# Patient Record
Sex: Male | Born: 1955 | Race: White | Hispanic: No | Marital: Married | Smoking: Former smoker
Health system: Southern US, Community
[De-identification: ages and names within clinical notes are randomized; demographics above are authoritative.]

## PROBLEM LIST (undated history)

## (undated) DIAGNOSIS — M1990 Unspecified osteoarthritis, unspecified site: Secondary | ICD-10-CM

## (undated) DIAGNOSIS — Z9889 Other specified postprocedural states: Secondary | ICD-10-CM

## (undated) DIAGNOSIS — T8859XA Other complications of anesthesia, initial encounter: Secondary | ICD-10-CM

## (undated) DIAGNOSIS — R233 Spontaneous ecchymoses: Secondary | ICD-10-CM

## (undated) DIAGNOSIS — G4730 Sleep apnea, unspecified: Secondary | ICD-10-CM

## (undated) DIAGNOSIS — R238 Other skin changes: Secondary | ICD-10-CM

## (undated) DIAGNOSIS — I10 Essential (primary) hypertension: Secondary | ICD-10-CM

## (undated) DIAGNOSIS — T4145XA Adverse effect of unspecified anesthetic, initial encounter: Secondary | ICD-10-CM

## (undated) DIAGNOSIS — R112 Nausea with vomiting, unspecified: Secondary | ICD-10-CM

## (undated) HISTORY — PX: COLONOSCOPY: SHX174

---
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
## 2007-05-06 HISTORY — PX: KNEE ARTHROSCOPY: SUR90

## 2010-05-05 HISTORY — PX: TENDON REPAIR: SHX5111

## 2010-10-11 ENCOUNTER — Other Ambulatory Visit (HOSPITAL_COMMUNITY): Payer: Self-pay | Admitting: Orthopedic Surgery

## 2010-10-11 ENCOUNTER — Ambulatory Visit (HOSPITAL_COMMUNITY)
Admission: RE | Admit: 2010-10-11 | Discharge: 2010-10-11 | Disposition: A | Discharge status: Home / Self Care | Payer: Worker's Compensation | Source: Ambulatory Visit | Attending: Orthopedic Surgery | Admitting: Orthopedic Surgery

## 2010-10-11 DIAGNOSIS — Z1389 Encounter for screening for other disorder: Secondary | ICD-10-CM | POA: Insufficient documentation

## 2010-10-11 DIAGNOSIS — J0100 Acute maxillary sinusitis, unspecified: Secondary | ICD-10-CM | POA: Insufficient documentation

## 2010-10-11 DIAGNOSIS — Z77018 Contact with and (suspected) exposure to other hazardous metals: Secondary | ICD-10-CM

## 2013-05-02 ENCOUNTER — Encounter (HOSPITAL_COMMUNITY): Payer: Self-pay

## 2013-05-03 ENCOUNTER — Other Ambulatory Visit (HOSPITAL_COMMUNITY): Payer: Self-pay | Admitting: *Deleted

## 2013-05-03 ENCOUNTER — Other Ambulatory Visit: Payer: Self-pay | Admitting: Orthopedic Surgery

## 2013-05-03 NOTE — Pre-Procedure Instructions (Signed)
Eric Reilly  05/03/2013   Your procedure is scheduled on:  Monday, May 16, 2013 at 9:55 AM.   Report to [facility] Entrance "A" Admitting Office at 7:55 AM.   Call this number if you have problems the morning of surgery: [phone]   Remember:   Do not eat food or drink liquids after midnight Sunday, 05/15/13.   Take these medicines the morning of surgery with A SIP OF WATER: none  Stop Aspirin as of 05/09/13   Do not wear jewelry.  Do not wear lotions, powders, or cologne. You may wear deodorant.  Men may shave face and neck.  Do not bring valuables to the hospital.  [facility] is not responsible                  for any belongings or valuables.               Contacts, dentures or bridgework may not be worn into surgery.  Leave suitcase in the car. After surgery it may be brought to your room.  For patients admitted to the hospital, discharge time is determined by your                treatment team.                 Special Instructions: Shower using CHG 2 nights before surgery and the night before surgery.  If you shower the day of surgery use CHG.  Use special wash - you have one bottle of CHG for all showers.  You should use approximately 1/3 of the bottle for each shower.   Please read over the following fact sheets that you were given: Pain Booklet, Coughing and Deep Breathing, Blood Transfusion Information, MRSA Information and Surgical Site Infection Prevention

## 2013-05-04 ENCOUNTER — Encounter (HOSPITAL_COMMUNITY)
Admission: RE | Admit: 2013-05-04 | Discharge: 2013-05-04 | Disposition: A | Discharge status: Home / Self Care | Payer: Worker's Compensation | Source: Ambulatory Visit | Attending: Orthopedic Surgery | Admitting: Orthopedic Surgery

## 2013-05-04 ENCOUNTER — Encounter (HOSPITAL_COMMUNITY): Payer: Self-pay

## 2013-05-04 DIAGNOSIS — Z01818 Encounter for other preprocedural examination: Secondary | ICD-10-CM | POA: Insufficient documentation

## 2013-05-04 DIAGNOSIS — Z01812 Encounter for preprocedural laboratory examination: Secondary | ICD-10-CM | POA: Insufficient documentation

## 2013-05-04 DIAGNOSIS — Z01810 Encounter for preprocedural cardiovascular examination: Secondary | ICD-10-CM | POA: Insufficient documentation

## 2013-05-04 HISTORY — DX: Spontaneous ecchymoses: R23.3

## 2013-05-04 HISTORY — DX: Essential (primary) hypertension: I10

## 2013-05-04 HISTORY — DX: Sleep apnea, unspecified: G47.30

## 2013-05-04 HISTORY — DX: Unspecified osteoarthritis, unspecified site: M19.90

## 2013-05-04 HISTORY — DX: Other skin changes: R23.8

## 2013-05-04 LAB — URINALYSIS, ROUTINE W REFLEX MICROSCOPIC
Bilirubin Urine: NEGATIVE
Hgb urine dipstick: NEGATIVE
Nitrite: NEGATIVE
Protein, ur: NEGATIVE mg/dL
Specific Gravity, Urine: 1.009 (ref 1.005–1.030)
Urobilinogen, UA: 0.2 mg/dL (ref 0.0–1.0)

## 2013-05-04 LAB — COMPREHENSIVE METABOLIC PANEL
ALT: 18 U/L (ref 0–53)
Albumin: 4 g/dL (ref 3.5–5.2)
Alkaline Phosphatase: 65 U/L (ref 39–117)
BUN: 13 mg/dL (ref 6–23)
CO2: 27 mEq/L (ref 19–32)
Calcium: 9 mg/dL (ref 8.4–10.5)
Creatinine, Ser: 0.78 mg/dL (ref 0.50–1.35)
GFR calc Af Amer: >90 mL/min (ref >90)
Glucose, Bld: 95 mg/dL (ref 70–99)
Potassium: 4.8 mEq/L (ref 3.7–5.3)
Total Protein: 6.9 g/dL (ref 6.0–8.3)

## 2013-05-04 LAB — CBC WITH DIFFERENTIAL/PLATELET
Basophils Relative: 1 % (ref 0–1)
Eosinophils Absolute: 0.1 10*3/uL (ref 0.0–0.7)
Eosinophils Relative: 2 % (ref 0–5)
Hemoglobin: 16.6 g/dL (ref 13.0–17.0)
Lymphocytes Relative: 22 % (ref 12–46)
Lymphs Abs: 1.6 10*3/uL (ref 0.7–4.0)
MCH: 31.3 pg (ref 26.0–34.0)
MCHC: 34.9 g/dL (ref 30.0–36.0)
MCV: 89.8 fL (ref 78.0–100.0)
Monocytes Absolute: 0.7 10*3/uL (ref 0.1–1.0)
Monocytes Relative: 9 % (ref 3–12)
Neutrophils Relative %: 67 % (ref 43–77)
RBC: 5.3 MIL/uL (ref 4.22–5.81)

## 2013-05-04 LAB — PROTIME-INR
INR: 0.93 (ref 0.00–1.49)
Prothrombin Time: 12.3 seconds (ref 11.6–15.2)

## 2013-05-04 LAB — ABO/RH: ABO/RH(D): B POS

## 2013-05-04 LAB — TYPE AND SCREEN
ABO/RH(D): B POS
Antibody Screen: NEGATIVE

## 2013-05-04 LAB — APTT: aPTT: 28 seconds (ref 24–37)

## 2013-05-04 NOTE — Progress Notes (Signed)
Last OV notes and sleep study requested from Dr. Darnelle Maffucci office in [place], [place].

## 2013-05-05 LAB — URINE CULTURE
Colony Count: NO GROWTH
Culture: NO GROWTH

## 2013-05-13 NOTE — Progress Notes (Signed)
Patient made aware of new arrival time. DA

## 2013-05-15 MED ORDER — TRANEXAMIC ACID 100 MG/ML IV SOLN
1000.0000 mg | INTRAVENOUS | Status: AC
  Administered 2013-05-16: 1000 mg via INTRAVENOUS
  Filled 2013-05-15 (×2): qty 10

## 2013-05-15 MED ORDER — CEFAZOLIN SODIUM 10 G IJ SOLR
3.0000 g | INTRAMUSCULAR | Status: AC
  Administered 2013-05-16: 3 g via INTRAVENOUS
  Filled 2013-05-15: qty 3000

## 2013-05-15 MED ORDER — CHLORHEXIDINE GLUCONATE 4 % EX LIQD: 60.0000 mL | Freq: Once | CUTANEOUS | Status: DC

## 2013-05-15 MED ORDER — SODIUM CHLORIDE 0.9 % IV SOLN: INTRAVENOUS | Status: DC

## 2013-05-15 MED ORDER — BUPIVACAINE LIPOSOME 1.3 % IJ SUSP
20.0000 mL | Freq: Once | INTRAMUSCULAR | Status: DC
  Filled 2013-05-15 (×2): qty 20

## 2013-05-16 ENCOUNTER — Inpatient Hospital Stay (HOSPITAL_COMMUNITY)
Admission: RE | Admit: 2013-05-16 | Discharge: 2013-05-17 | DRG: 470 | Disposition: A | Discharge status: Home Health | Payer: BC Managed Care – PPO | Source: Ambulatory Visit | Attending: Orthopedic Surgery | Admitting: Orthopedic Surgery

## 2013-05-16 ENCOUNTER — Encounter (HOSPITAL_COMMUNITY): Payer: BC Managed Care – PPO | Admitting: Anesthesiology

## 2013-05-16 ENCOUNTER — Encounter (HOSPITAL_COMMUNITY)
Admission: RE | Disposition: A | Discharge status: Home Health | Payer: Self-pay | Source: Ambulatory Visit | Attending: Orthopedic Surgery

## 2013-05-16 ENCOUNTER — Inpatient Hospital Stay (HOSPITAL_COMMUNITY): Payer: BC Managed Care – PPO | Admitting: Anesthesiology

## 2013-05-16 ENCOUNTER — Encounter (HOSPITAL_COMMUNITY): Payer: Self-pay

## 2013-05-16 DIAGNOSIS — D62 Acute posthemorrhagic anemia: Secondary | ICD-10-CM | POA: Diagnosis not present

## 2013-05-16 DIAGNOSIS — Z823 Family history of stroke: Secondary | ICD-10-CM

## 2013-05-16 DIAGNOSIS — F17200 Nicotine dependence, unspecified, uncomplicated: Secondary | ICD-10-CM | POA: Diagnosis present

## 2013-05-16 DIAGNOSIS — Z833 Family history of diabetes mellitus: Secondary | ICD-10-CM

## 2013-05-16 DIAGNOSIS — Z820 Family history of epilepsy and other diseases of the nervous system: Secondary | ICD-10-CM

## 2013-05-16 DIAGNOSIS — G4730 Sleep apnea, unspecified: Secondary | ICD-10-CM | POA: Diagnosis present

## 2013-05-16 DIAGNOSIS — I10 Essential (primary) hypertension: Secondary | ICD-10-CM | POA: Diagnosis present

## 2013-05-16 DIAGNOSIS — M1710 Unilateral primary osteoarthritis, unspecified knee: Principal | ICD-10-CM | POA: Diagnosis present

## 2013-05-16 DIAGNOSIS — Z8249 Family history of ischemic heart disease and other diseases of the circulatory system: Secondary | ICD-10-CM

## 2013-05-16 DIAGNOSIS — Z96659 Presence of unspecified artificial knee joint: Secondary | ICD-10-CM

## 2013-05-16 HISTORY — PX: TOTAL KNEE ARTHROPLASTY: SHX125

## 2013-05-16 LAB — CREATININE, SERUM
CREATININE: 0.78 mg/dL (ref 0.50–1.35)
GFR calc Af Amer: >90 mL/min (ref >90)

## 2013-05-16 LAB — CBC
HCT: 44.6 % (ref 39.0–52.0)
HEMOGLOBIN: 15.5 g/dL (ref 13.0–17.0)
MCH: 30.6 pg (ref 26.0–34.0)
MCHC: 34.8 g/dL (ref 30.0–36.0)
MCV: 88.1 fL (ref 78.0–100.0)
Platelets: 222 10*3/uL (ref 150–400)
RBC: 5.06 MIL/uL (ref 4.22–5.81)
RDW: 13.8 % (ref 11.5–15.5)
WBC: 11.4 10*3/uL — ABNORMAL HIGH (ref 4.0–10.5)

## 2013-05-16 SURGERY — ARTHROPLASTY, KNEE, TOTAL
Anesthesia: General | Site: Knee | Laterality: Left

## 2013-05-16 MED ORDER — ZOLPIDEM TARTRATE 5 MG PO TABS
5.0000 mg | ORAL_TABLET | Freq: Every evening | ORAL | Status: DC | PRN
Start: 2013-05-16 — End: 2013-05-17

## 2013-05-16 MED ORDER — CELECOXIB 200 MG PO CAPS
200.0000 mg | ORAL_CAPSULE | Freq: Two times a day (BID) | ORAL | Status: DC
  Administered 2013-05-16 – 2013-05-17 (×3): 200 mg via ORAL
  Filled 2013-05-16 (×5): qty 1

## 2013-05-16 MED ORDER — LIDOCAINE HCL (CARDIAC) 20 MG/ML IV SOLN
INTRAVENOUS | Status: DC | PRN
  Administered 2013-05-16: 100 mg via INTRAVENOUS

## 2013-05-16 MED ORDER — METOCLOPRAMIDE HCL 5 MG/ML IJ SOLN
5.0000 mg | Freq: Three times a day (TID) | INTRAMUSCULAR | Status: DC | PRN
  Filled 2013-05-16: qty 2

## 2013-05-16 MED ORDER — HYDROMORPHONE HCL PF 1 MG/ML IJ SOLN
INTRAMUSCULAR | Status: AC
  Filled 2013-05-16: qty 1

## 2013-05-16 MED ORDER — METOCLOPRAMIDE HCL 5 MG PO TABS
5.0000 mg | ORAL_TABLET | Freq: Three times a day (TID) | ORAL | Status: DC | PRN
  Filled 2013-05-16: qty 2

## 2013-05-16 MED ORDER — PROPOFOL 10 MG/ML IV BOLUS
INTRAVENOUS | Status: DC | PRN
  Administered 2013-05-16: 200 mg via INTRAVENOUS

## 2013-05-16 MED ORDER — SENNOSIDES-DOCUSATE SODIUM 8.6-50 MG PO TABS: 1.0000 | ORAL_TABLET | Freq: Every evening | ORAL | Status: DC | PRN

## 2013-05-16 MED ORDER — OXYCODONE HCL 5 MG PO TABS: 5.0000 mg | ORAL_TABLET | Freq: Once | ORAL | Status: DC | PRN

## 2013-05-16 MED ORDER — OXYCODONE HCL 5 MG/5ML PO SOLN: 5.0000 mg | Freq: Once | ORAL | Status: DC | PRN

## 2013-05-16 MED ORDER — NEOSTIGMINE METHYLSULFATE 1 MG/ML IJ SOLN
INTRAMUSCULAR | Status: DC | PRN
  Administered 2013-05-16: 4 mg via INTRAVENOUS

## 2013-05-16 MED ORDER — DIPHENHYDRAMINE HCL 12.5 MG/5ML PO ELIX: 12.5000 mg | ORAL_SOLUTION | ORAL | Status: DC | PRN

## 2013-05-16 MED ORDER — ROCURONIUM BROMIDE 100 MG/10ML IV SOLN
INTRAVENOUS | Status: DC | PRN
  Administered 2013-05-16: 50 mg via INTRAVENOUS

## 2013-05-16 MED ORDER — FENTANYL CITRATE 0.05 MG/ML IJ SOLN: 50.0000 ug | Freq: Once | INTRAMUSCULAR | Status: DC

## 2013-05-16 MED ORDER — ACETAMINOPHEN 650 MG RE SUPP
650.0000 mg | Freq: Four times a day (QID) | RECTAL | Status: DC | PRN
Start: 2013-05-16 — End: 2013-05-17

## 2013-05-16 MED ORDER — EPHEDRINE SULFATE 50 MG/ML IJ SOLN
INTRAMUSCULAR | Status: DC | PRN
Start: 2013-05-16 — End: 2013-05-16
  Administered 2013-05-16 (×2): 15 mg via INTRAVENOUS

## 2013-05-16 MED ORDER — BUPIVACAINE-EPINEPHRINE 0.5% -1:200000 IJ SOLN
INTRAMUSCULAR | Status: DC | PRN
  Administered 2013-05-16: 30 mL

## 2013-05-16 MED ORDER — SODIUM CHLORIDE 0.9 % IR SOLN
Status: DC | PRN
  Administered 2013-05-16: 1000 mL

## 2013-05-16 MED ORDER — PHENYLEPHRINE HCL 10 MG/ML IJ SOLN
INTRAMUSCULAR | Status: DC | PRN
  Administered 2013-05-16: 40 ug via INTRAVENOUS

## 2013-05-16 MED ORDER — FLEET ENEMA 7-19 GM/118ML RE ENEM: 1.0000 | ENEMA | Freq: Once | RECTAL | Status: AC | PRN

## 2013-05-16 MED ORDER — ONDANSETRON HCL 4 MG PO TABS
4.0000 mg | ORAL_TABLET | Freq: Four times a day (QID) | ORAL | Status: DC | PRN
  Administered 2013-05-16: 4 mg via ORAL
  Filled 2013-05-16: qty 1

## 2013-05-16 MED ORDER — BUPIVACAINE-EPINEPHRINE (PF) 0.5% -1:200000 IJ SOLN
INTRAMUSCULAR | Status: AC
  Filled 2013-05-16: qty 10

## 2013-05-16 MED ORDER — ALUM & MAG HYDROXIDE-SIMETH 200-200-20 MG/5ML PO SUSP: 30.0000 mL | ORAL | Status: DC | PRN

## 2013-05-16 MED ORDER — MIDAZOLAM HCL 5 MG/5ML IJ SOLN
INTRAMUSCULAR | Status: DC | PRN
  Administered 2013-05-16: 2 mg via INTRAVENOUS

## 2013-05-16 MED ORDER — BUPIVACAINE-EPINEPHRINE PF 0.5-1:200000 % IJ SOLN
INTRAMUSCULAR | Status: DC | PRN
  Administered 2013-05-16: 25 mL

## 2013-05-16 MED ORDER — OXYCODONE HCL 5 MG PO TABS
ORAL_TABLET | ORAL | Status: AC
  Filled 2013-05-16: qty 2

## 2013-05-16 MED ORDER — METHOCARBAMOL 500 MG PO TABS
500.0000 mg | ORAL_TABLET | Freq: Four times a day (QID) | ORAL | Status: DC | PRN
  Administered 2013-05-16 – 2013-05-17 (×4): 500 mg via ORAL
  Filled 2013-05-16 (×5): qty 1

## 2013-05-16 MED ORDER — MIDAZOLAM HCL 2 MG/2ML IJ SOLN: 1.0000 mg | INTRAMUSCULAR | Status: DC | PRN

## 2013-05-16 MED ORDER — ONDANSETRON HCL 4 MG/2ML IJ SOLN: 4.0000 mg | Freq: Four times a day (QID) | INTRAMUSCULAR | Status: DC | PRN

## 2013-05-16 MED ORDER — HYDROMORPHONE HCL PF 1 MG/ML IJ SOLN
0.2500 mg | INTRAMUSCULAR | Status: DC | PRN
  Administered 2013-05-16: 0.5 mg via INTRAVENOUS
  Administered 2013-05-16: 1 mg via INTRAVENOUS
  Administered 2013-05-16: 0.5 mg via INTRAVENOUS

## 2013-05-16 MED ORDER — DOCUSATE SODIUM 100 MG PO CAPS
100.0000 mg | ORAL_CAPSULE | Freq: Two times a day (BID) | ORAL | Status: DC
  Administered 2013-05-16 – 2013-05-17 (×3): 100 mg via ORAL
  Filled 2013-05-16 (×3): qty 1

## 2013-05-16 MED ORDER — ACETAMINOPHEN 325 MG PO TABS: 650.0000 mg | ORAL_TABLET | Freq: Four times a day (QID) | ORAL | Status: DC | PRN

## 2013-05-16 MED ORDER — HYDROMORPHONE HCL PF 1 MG/ML IJ SOLN: 1.0000 mg | INTRAMUSCULAR | Status: DC | PRN

## 2013-05-16 MED ORDER — PROMETHAZINE HCL 25 MG/ML IJ SOLN: 6.2500 mg | INTRAMUSCULAR | Status: DC | PRN

## 2013-05-16 MED ORDER — ONDANSETRON HCL 4 MG/2ML IJ SOLN
INTRAMUSCULAR | Status: DC | PRN
  Administered 2013-05-16: 4 mg via INTRAVENOUS

## 2013-05-16 MED ORDER — DEXAMETHASONE SODIUM PHOSPHATE 10 MG/ML IJ SOLN
INTRAMUSCULAR | Status: DC | PRN
  Administered 2013-05-16: 4 mg

## 2013-05-16 MED ORDER — ENOXAPARIN SODIUM 30 MG/0.3ML ~~LOC~~ SOLN
30.0000 mg | Freq: Two times a day (BID) | SUBCUTANEOUS | Status: DC
  Administered 2013-05-17: 30 mg via SUBCUTANEOUS
  Filled 2013-05-16 (×2): qty 0.3

## 2013-05-16 MED ORDER — LACTATED RINGERS IV SOLN
INTRAVENOUS | Status: DC | PRN
  Administered 2013-05-16 (×2): via INTRAVENOUS

## 2013-05-16 MED ORDER — SODIUM CHLORIDE 0.9 % IV SOLN
INTRAVENOUS | Status: DC
  Administered 2013-05-16 – 2013-05-17 (×2): 1000 mL via INTRAVENOUS

## 2013-05-16 MED ORDER — LISINOPRIL 5 MG PO TABS
5.0000 mg | ORAL_TABLET | Freq: Every day | ORAL | Status: DC
Start: 2013-05-16 — End: 2013-05-17
  Administered 2013-05-16: 5 mg via ORAL
  Filled 2013-05-16 (×2): qty 1

## 2013-05-16 MED ORDER — MENTHOL 3 MG MT LOZG: 1.0000 | LOZENGE | OROMUCOSAL | Status: DC | PRN

## 2013-05-16 MED ORDER — METHOCARBAMOL 100 MG/ML IJ SOLN
500.0000 mg | Freq: Four times a day (QID) | INTRAVENOUS | Status: DC | PRN
  Filled 2013-05-16: qty 5

## 2013-05-16 MED ORDER — CEFAZOLIN SODIUM-DEXTROSE 2-3 GM-% IV SOLR
2.0000 g | Freq: Four times a day (QID) | INTRAVENOUS | Status: AC
  Administered 2013-05-16 (×2): 2 g via INTRAVENOUS
  Filled 2013-05-16 (×2): qty 50

## 2013-05-16 MED ORDER — OXYCODONE HCL 5 MG PO TABS
5.0000 mg | ORAL_TABLET | ORAL | Status: DC | PRN
  Administered 2013-05-16: 10 mg via ORAL
  Administered 2013-05-16: 5 mg via ORAL
  Administered 2013-05-16: 10 mg via ORAL
  Administered 2013-05-16: 5 mg via ORAL
  Administered 2013-05-16 – 2013-05-17 (×2): 10 mg via ORAL
  Administered 2013-05-17 (×2): 5 mg via ORAL
  Administered 2013-05-17: 10 mg via ORAL
  Filled 2013-05-16 (×5): qty 2
  Filled 2013-05-16: qty 1
  Filled 2013-05-16 (×2): qty 2

## 2013-05-16 MED ORDER — TRANEXAMIC ACID 100 MG/ML IV SOLN
1000.0000 mg | INTRAVENOUS | Status: DC
  Filled 2013-05-16: qty 10

## 2013-05-16 MED ORDER — METHOCARBAMOL 500 MG PO TABS
ORAL_TABLET | ORAL | Status: AC
  Filled 2013-05-16: qty 1

## 2013-05-16 MED ORDER — FENTANYL CITRATE 0.05 MG/ML IJ SOLN
INTRAMUSCULAR | Status: DC | PRN
  Administered 2013-05-16 (×5): 50 ug via INTRAVENOUS

## 2013-05-16 MED ORDER — BISACODYL 5 MG PO TBEC: 5.0000 mg | DELAYED_RELEASE_TABLET | Freq: Every day | ORAL | Status: DC | PRN

## 2013-05-16 MED ORDER — BUPIVACAINE LIPOSOME 1.3 % IJ SUSP
INTRAMUSCULAR | Status: DC | PRN
  Administered 2013-05-16: 20 mL

## 2013-05-16 MED ORDER — PHENOL 1.4 % MT LIQD: 1.0000 | OROMUCOSAL | Status: DC | PRN

## 2013-05-16 MED ORDER — GLYCOPYRROLATE 0.2 MG/ML IJ SOLN
INTRAMUSCULAR | Status: DC | PRN
  Administered 2013-05-16: .6 mg via INTRAVENOUS

## 2013-05-16 SURGICAL SUPPLY — 61 items
BANDAGE ESMARK 6X9 LF (GAUZE/BANDAGES/DRESSINGS) ×1 IMPLANT
BLADE SAGITTAL 13X1.27X60 (BLADE) ×2 IMPLANT
BLADE SAGITTAL 13X1.27X60MM (BLADE) ×1
BLADE SAW SGTL 83.5X18.5 (BLADE) ×3 IMPLANT
BLADE SURG 10 STRL SS (BLADE) ×6 IMPLANT
BNDG ESMARK 6X9 LF (GAUZE/BANDAGES/DRESSINGS) ×3
BOWL SMART MIX CTS (DISPOSABLE) ×3 IMPLANT
CAP POR NKTM CP VIT E LN CER ×3 IMPLANT
CEMENT BONE SIMPLEX SPEEDSET (Cement) ×6 IMPLANT
COVER SURGICAL LIGHT HANDLE (MISCELLANEOUS) ×3 IMPLANT
CUFF TOURNIQUET SINGLE 34IN LL (TOURNIQUET CUFF) ×3 IMPLANT
DRAPE EXTREMITY T 121X128X90 (DRAPE) ×3 IMPLANT
DRAPE INCISE IOBAN 66X45 STRL (DRAPES) ×6 IMPLANT
DRAPE PROXIMA HALF (DRAPES) ×3 IMPLANT
DRAPE U-SHAPE 47X51 STRL (DRAPES) ×3 IMPLANT
DRSG ADAPTIC 3X8 NADH LF (GAUZE/BANDAGES/DRESSINGS) ×3 IMPLANT
DRSG PAD ABDOMINAL 8X10 ST (GAUZE/BANDAGES/DRESSINGS) ×3 IMPLANT
DURAPREP 26ML APPLICATOR (WOUND CARE) ×3 IMPLANT
ELECT REM PT RETURN 9FT ADLT (ELECTROSURGICAL) ×3
ELECTRODE REM PT RTRN 9FT ADLT (ELECTROSURGICAL) ×1 IMPLANT
EVACUATOR 1/8 PVC DRAIN (DRAIN) ×3 IMPLANT
GLOVE BIO SURGEON STRL SZ8.5 (GLOVE) ×3 IMPLANT
GLOVE BIOGEL M 7.0 STRL (GLOVE) IMPLANT
GLOVE BIOGEL PI IND STRL 7.5 (GLOVE) IMPLANT
GLOVE BIOGEL PI IND STRL 8 (GLOVE) ×1 IMPLANT
GLOVE BIOGEL PI IND STRL 8.5 (GLOVE) ×2 IMPLANT
GLOVE BIOGEL PI INDICATOR 7.5 (GLOVE)
GLOVE BIOGEL PI INDICATOR 8 (GLOVE) ×2
GLOVE BIOGEL PI INDICATOR 8.5 (GLOVE) ×4
GLOVE SURG ORTHO 8.0 STRL STRW (GLOVE) ×9 IMPLANT
GLOVE SURG SS PI 8.5 STRL IVOR (GLOVE) ×2
GLOVE SURG SS PI 8.5 STRL STRW (GLOVE) ×1 IMPLANT
GOWN PREVENTION PLUS XLARGE (GOWN DISPOSABLE) IMPLANT
GOWN STRL NON-REIN LRG LVL3 (GOWN DISPOSABLE) IMPLANT
GOWN STRL REUS W/ TWL XL LVL3 (GOWN DISPOSABLE) ×3 IMPLANT
GOWN STRL REUS W/TWL XL LVL3 (GOWN DISPOSABLE) ×6
HANDPIECE INTERPULSE COAX TIP (DISPOSABLE) ×2
HOOD PEEL AWAY FACE SHEILD DIS (HOOD) ×9 IMPLANT
KIT BASIN OR (CUSTOM PROCEDURE TRAY) ×3 IMPLANT
KIT ROOM TURNOVER OR (KITS) ×3 IMPLANT
MANIFOLD NEPTUNE II (INSTRUMENTS) ×3 IMPLANT
NEEDLE 22X1 1/2 (OR ONLY) (NEEDLE) ×3 IMPLANT
NS IRRIG 1000ML POUR BTL (IV SOLUTION) IMPLANT
PACK TOTAL JOINT (CUSTOM PROCEDURE TRAY) ×3 IMPLANT
PAD ARMBOARD 7.5X6 YLW CONV (MISCELLANEOUS) ×3 IMPLANT
PADDING CAST COTTON 6X4 STRL (CAST SUPPLIES) ×3 IMPLANT
SET HNDPC FAN SPRY TIP SCT (DISPOSABLE) ×1 IMPLANT
SPONGE GAUZE 4X4 12PLY (GAUZE/BANDAGES/DRESSINGS) ×3 IMPLANT
STAPLER VISISTAT 35W (STAPLE) ×3 IMPLANT
SUCTION FRAZIER TIP 10 FR DISP (SUCTIONS) ×3 IMPLANT
SUT BONE WAX W31G (SUTURE) ×3 IMPLANT
SUT VIC AB 0 CTB1 27 (SUTURE) ×6 IMPLANT
SUT VIC AB 1 CT1 27 (SUTURE) ×6
SUT VIC AB 1 CT1 27XBRD ANBCTR (SUTURE) ×3 IMPLANT
SUT VIC AB 2-0 CT1 27 (SUTURE) ×4
SUT VIC AB 2-0 CT1 TAPERPNT 27 (SUTURE) ×2 IMPLANT
SYR CONTROL 10ML LL (SYRINGE) ×3 IMPLANT
TOWEL OR 17X24 6PK STRL BLUE (TOWEL DISPOSABLE) ×3 IMPLANT
TOWEL OR 17X26 10 PK STRL BLUE (TOWEL DISPOSABLE) ×3 IMPLANT
TRAY FOLEY CATH 14FR (SET/KITS/TRAYS/PACK) IMPLANT
WATER STERILE IRR 1000ML POUR (IV SOLUTION) IMPLANT

## 2013-05-16 NOTE — Anesthesia Procedure Notes (Addendum)
Anesthesia Regional Block:  Adductor canal block  Pre-Anesthetic Checklist: ,, timeout performed, Correct Patient, Correct Site, Correct Laterality, Correct Procedure, Correct Position, site marked, Risks and benefits discussed,  Surgical consent,  Pre-op evaluation,  At surgeon's request and post-op pain management  Laterality: Left  Prep: chloraprep       Needles:  Injection technique: Single-shot  Needle Type: Echogenic Stimulator Needle      Needle Gauge: 22 and 22 G    Additional Needles:  Procedures: ultrasound guided (picture in chart) Adductor canal block Narrative:  Start time: 05/16/2013 7:02 AM End time: 05/16/2013 7:16 AM Injection made incrementally with aspirations every 5 mL. Anesthesiologist: Dr Gypsy BalsamKasik  Additional Notes: [id number]: [id number] L Adductor Canal Block POP CHG prep, sterile tech Good US visualization-PIX in chart Multiple neg asp Marc .5% w/epi 1:200000 total 25cc+decadron 4mg  infiltrated No compl Dr Gypsy BalsamKasik   Procedure Name: Intubation Date/Time: 05/16/2013 7:39 AM Performed by: Rogelia BogaMUELLER, Kaylee Wombles P Pre-anesthesia Checklist: Patient identified, Emergency Drugs available, Suction available, Patient being monitored and Timeout performed Patient Re-evaluated:Patient Re-evaluated prior to inductionOxygen Delivery Method: Circle system utilized Preoxygenation: Pre-oxygenation with 100% oxygen Intubation Type: IV induction Ventilation: Mask ventilation without difficulty Laryngoscope Size: Mac and 4 Grade View: Grade II Tube type: Oral Tube size: 7.5 mm Number of attempts: 1 Airway Equipment and Method: Stylet Placement Confirmation: ETT inserted through vocal cords under direct vision,  positive ETCO2 and breath sounds checked- equal and bilateral Secured at: 22 cm Tube secured with: Tape Dental Injury: Teeth and Oropharynx as per pre-operative assessment

## 2013-05-16 NOTE — Anesthesia Postprocedure Evaluation (Signed)
  Anesthesia Post-op Note  Patient: Eric Reilly  Procedure(s) Performed: Procedure(s): LEFT TOTAL KNEE ARTHROPLASTY (Left)  Patient Location: PACU  Anesthesia Type:GA combined with regional for post-op pain  Level of Consciousness: awake and alert   Airway and Oxygen Therapy: Patient Spontanous Breathing  Post-op Pain: mild  Post-op Assessment: Post-op Vital signs reviewed, Patient's Cardiovascular Status Stable, Respiratory Function Stable, Patent Airway, No signs of Nausea or vomiting and Pain level controlled  Post-op Vital Signs: Reviewed and stable  Complications: No apparent anesthesia complications

## 2013-05-16 NOTE — Anesthesia Preprocedure Evaluation (Addendum)
Anesthesia Evaluation  Patient identified by MRN, date of birth, ID band Patient awake    Reviewed: Allergy & Precautions, H&P , NPO status , Patient's Chart, lab work & pertinent test results  Airway Mallampati: II TM Distance: >3 FB Neck ROM: Full    Dental  (+) Teeth Intact   Pulmonary sleep apnea and Continuous Positive Airway Pressure Ventilation , COPDCurrent Smoker,  + rhonchi         Cardiovascular hypertension, Pt. on medications Rhythm:Regular Rate:Normal     Neuro/Psych    GI/Hepatic   Endo/Other    Renal/GU      Musculoskeletal   Abdominal   Peds  Hematology   Anesthesia Other Findings   Reproductive/Obstetrics                         Anesthesia Physical Anesthesia Plan  ASA: II  Anesthesia Plan: General   Post-op Pain Management:    Induction: Intravenous  Airway Management Planned: Oral ETT  Additional Equipment:   Intra-op Plan:   Post-operative Plan: Extubation in OR  Informed Consent: I have reviewed the patients History and Physical, chart, labs and discussed the procedure including the risks, benefits and alternatives for the proposed anesthesia with the patient or authorized representative who has indicated his/her understanding and acceptance.   Dental advisory given  Plan Discussed with: CRNA, Surgeon and Anesthesiologist  Anesthesia Plan Comments:        Anesthesia Quick Evaluation

## 2013-05-16 NOTE — Progress Notes (Signed)
Utilization review completed.  

## 2013-05-16 NOTE — Progress Notes (Addendum)
Orthopedic Tech Progress Note Patient Details:  Nonnie DoneJeffrey Laswell [birth year] [id number] CPM applied to Left LE with appropriate settings. OHF applied to bed. Footsie roll provided. CPM Left Knee CPM Left Knee: On Left Knee Flexion (Degrees): 90 Left Knee Extension (Degrees): 0   Asia R Thompson 05/16/2013, 10:47 AM

## 2013-05-16 NOTE — Transfer of Care (Signed)
Immediate Anesthesia Transfer of Care Note  Patient: Eric Reilly  Procedure(s) Performed: Procedure(s): LEFT TOTAL KNEE ARTHROPLASTY (Left)  Patient Location: PACU  Anesthesia Type:GA combined with regional for post-op pain  Level of Consciousness: awake, alert , oriented and patient cooperative  Airway & Oxygen Therapy: Patient Spontanous Breathing and Patient connected to nasal cannula oxygen  Post-op Assessment: Report given to PACU RN, Post -op Vital signs reviewed and stable and Patient moving all extremities X 4  Post vital signs: Reviewed and stable  Complications: No apparent anesthesia complications

## 2013-05-16 NOTE — Op Note (Signed)
TOTAL KNEE REPLACEMENT OPERATIVE NOTE:  05/16/2013  10:50 AM  PATIENT:  Eric DoneJeffrey Difonzo  58 y.o. male  PRE-OPERATIVE DIAGNOSIS:  osteoarthritis left knee  POST-OPERATIVE DIAGNOSIS:  osteoarthritis left knee  PROCEDURE:  Procedure(s): LEFT TOTAL KNEE ARTHROPLASTY  SURGEON:  Surgeon(s): Dannielle HuhSteve Zurie Platas, MD  PHYSICIAN ASSISTANT: Altamese CabalMaurice Jones, [facility]  ANESTHESIA:   general  DRAINS: Hemovac  SPECIMEN: None  COUNTS:  Correct  TOURNIQUET:   Total Tourniquet Time Documented: Thigh (Left) - 63 minutes Total: Thigh (Left) - 63 minutes   DICTATION:  Indication for procedure:    The patient is a 58 y.o. male who has failed conservative treatment for osteoarthritis left knee.  Informed consent was obtained prior to anesthesia. The risks versus benefits of the operation were explain and in a way the patient can, and did, understand.   On the implant demand matching protocol, this patient scored 9.  Therefore, this patient was not receive a polyethylene insert with vitamin E which is a high demand implant.  Description of procedure:     The patient was taken to the operating room and placed under anesthesia.  The patient was positioned in the usual fashion taking care that all body parts were adequately padded and/or protected.  I foley catheter was not placed.  A tourniquet was applied and the leg prepped and draped in the usual sterile fashion.  The extremity was exsanguinated with the esmarch and tourniquet inflated to 350 mmHg.  Pre-operative range of motion was normal.  The knee was in 5 degree of mild valgus.  A midline incision approximately 6-7 inches long was made with a #10 blade.  A new blade was used to make a parapatellar arthrotomy going 2-3 cm into the quadriceps tendon, over the patella, and alongside the medial aspect of the patellar tendon.  A synovectomy was then performed with the #10 blade and forceps. I then elevated the deep MCL off the medial tibial metaphysis  subperiosteally around to the semimembranosus attachment.    I everted the patella and used calipers to measure patellar thickness.  I used the reamer to ream down to appropriate thickness to recreate the native thickness.  I then removed excess bone with the rongeur and sagittal saw.  I used the appropriately sized template and drilled the three lug holes.  I then put the trial in place and measured the thickness with the calipers to ensure recreation of the native thickness.  The trial was then removed and the patella subluxed and the knee brought into flexion.  A homan retractor was place to retract and protect the patella and lateral structures.  A Z-retractor was place medially to protect the medial structures.  The extra-medullary alignment system was used to make cut the tibial articular surface perpendicular to the anamotic axis of the tibia and in 3 degrees of posterior slope.  The cut surface and alignment jig was removed.  I then used the intramedullary alignment guide to make a 4 valgus cut on the distal femur.  I then marked out the epicondylar axis on the distal femur.  The posterior condylar axis measured 8 degrees.  I then used the anterior referencing sizer and measured the femur to be a size 6.  The 4-In-1 cutting block was screwed into place in external rotation matching the posterior condylar angle, making our cuts perpendicular to the epicondylar axis.  Anterior, posterior and chamfer cuts were made with the sagittal saw.  The cutting block and cut pieces were removed.  A  lamina spreader was placed in 90 degrees of flexion.  The ACL, PCL, menisci, and posterior condylar osteophytes were removed.  A 10 mm spacer blocked was found to offer good flexion and extension gap balance after moderate in degree releasing.   The scoop retractor was then placed and the femoral finishing block was pinned in place.  The small sagittal saw was used as well as the lug drill to finish the femur.  The  block and cut surfaces were removed and the medullary canal hole filled with autograft bone from the cut pieces.  The tibia was delivered forward in deep flexion and external rotation.  A size E tray was selected and pinned into place centered on the medial 1/3 of the tibial tubercle.  The reamer and keel was used to prepare the tibia through the tray.    I then trialed with the size 8 femur, size E tibia, a 10 mm insert and the 32 patella.  I had excellent flexion/extension gap balance, excellent patella tracking.  Flexion was full and beyond 120 degrees; extension was zero.  These components were chosen and the staff opened them to me on the back table while the knee was lavaged copiously and the cement mixed.  The soft tissue was infiltrated with 60cc of exparel 1.3% through a 21 gauge needle.  I cemented in the components and removed all excess cement.  The polyethylene tibial component was snapped into place and the knee placed in extension while cement was hardening.  The capsule was infilltrated with 30cc of .25% Marcaine with epinephrine.  A hemovac was place in the joint exiting superolaterally.  A pain pump was place superomedially superficial to the arthrotomy.  Once the cement was hard, the tourniquet was let down.  Hemostasis was obtained.  The arthrotomy was closed with figure-8 #1 vicryl sutures.  The deep soft tissues were closed with #0 vicryls and the subcuticular layer closed with a running #2-0 vicryl.  The skin was reapproximated and closed with skin staples.  The wound was dressed with xeroform, 4 x4's, 2 ABD sponges, a single layer of webril and a TED stocking.   The patient was then awakened, extubated, and taken to the recovery room in stable condition.  BLOOD LOSS:  300cc DRAINS: 1 hemovac, 1 pain catheter COMPLICATIONS:  None.  PLAN OF CARE: Admit to inpatient   PATIENT DISPOSITION:  PACU - hemodynamically stable.   Delay start of Pharmacological VTE agent (>24hrs) due to  surgical blood loss or risk of bleeding:  not applicable  Please fax a copy of this op note to my office at [phone] (please only include page 1 and 2 of the Case Information op note)

## 2013-05-16 NOTE — Preoperative (Signed)
Beta Blockers   Reason not to administer Beta Blockers:Not Applicable 

## 2013-05-16 NOTE — H&P (Signed)
Eric Reilly MRN:  [id number] DOB/SEX:  January 28, 1956/male  CHIEF COMPLAINT:  Painful left Knee  HISTORY: Patient is a 58 y.o. male presented with a history of pain in the left knee. Onset of symptoms was gradual starting several years ago with gradually worsening course since that time. Prior procedures on the knee include arthroscopy. Patient has been treated conservatively with over-the-counter NSAIDs and activity modification. Patient currently rates pain in the knee at 9 out of 10 with activity. There is no pain at night.  PAST MEDICAL HISTORY: There are no active problems to display for this patient.  Past Medical History  Diagnosis Date  . Hypertension   . Sleep apnea   . Arthritis     osteoarthritis, left knee  . Bruises easily    Past Surgical History  Procedure Laterality Date  . Knee arthroscopy Left 2009  . Colonoscopy    . Tendon repair Left 2012     MEDICATIONS:   Prescriptions prior to admission  Medication Sig Dispense Refill  . aspirin 81 MG tablet Take 81 mg by mouth daily.      Marland Kitchen. lisinopril (PRINIVIL,ZESTRIL) 5 MG tablet Take 5 mg by mouth daily.        ALLERGIES:  No Known Allergies  REVIEW OF SYSTEMS:  Pertinent items are noted in HPI.   FAMILY HISTORY:   Family History  Problem Relation Age of Onset  . Cancer Mother   . Diabetes type II Mother   . Alzheimer's disease Mother   . CVA Father   . Heart disease Father     SOCIAL HISTORY:   History  Substance Use Topics  . Smoking status: Current Every Day Smoker -- 1.00 packs/day    Types: Cigarettes  . Smokeless tobacco: Former NeurosurgeonUser    Types: Chew  . Alcohol Use: Yes     Comment: "very little"     EXAMINATION:  Vital signs in last 24 hours: Temp:  [98.1 F (36.7 C)] 98.1 F (36.7 C) (01/12 0620) Pulse Rate:  [59] 59 (01/12 0620) Resp:  [18] 18 (01/12 0620) BP: (137)/(72) 137/72 mmHg (01/12 0620) SpO2:  [98 %] 98 % (01/12 0620)  General appearance: alert, cooperative and no  distress Lungs: clear to auscultation bilaterally Heart: regular rate and rhythm, S1, S2 normal, no murmur, click, rub or gallop Abdomen: soft, non-tender; bowel sounds normal; no masses,  no organomegaly Extremities: extremities normal, atraumatic, no cyanosis or edema and Homans sign is negative, no sign of DVT Pulses: 2+ and symmetric Skin: Skin color, texture, turgor normal. No rashes or lesions Neurologic: Alert and oriented X 3, normal strength and tone. Normal symmetric reflexes. Normal coordination and gait  Musculoskeletal:  ROM 0-115, Ligaments intact,  Imaging Review Plain radiographs demonstrate severe degenerative joint disease of the left knee. The overall alignment is mild valgus. The bone quality appears to be good for age and reported activity level.  Assessment/Plan: End stage arthritis, left knee   The patient history, physical examination and imaging studies are consistent with advanced degenerative joint disease of the left knee. The patient has failed conservative treatment.  The clearance notes were reviewed.  After discussion with the patient it was felt that Total Knee Replacement was indicated. The procedure,  risks, and benefits of total knee arthroplasty were presented and reviewed. The risks including but not limited to aseptic loosening, infection, blood clots, vascular injury, stiffness, patella tracking problems complications among others were discussed. The patient acknowledged the explanation, agreed to proceed with the  plan.  Eric Reilly 05/16/2013, 7:18 AM

## 2013-05-16 NOTE — Evaluation (Signed)
Physical Therapy Evaluation Patient Details Name: Nonnie DoneJeffrey Pudwill MRN: [id number] DOB: [birth year] Today's Date: 05/16/2013 Time: [id number] PT Time Calculation (min): 37 min  PT Assessment / Plan / Recommendation History of Present Illness  Patient is a 58 yo male s/p Lt TKA.  Clinical Impression  Patient presents with problems listed below.  Will benefit from acute PT to maximize independence prior to discharge home with wife.    PT Assessment  Patient needs continued PT services    Follow Up Recommendations  Home health PT;Supervision/Assistance - 24 hour    Does the patient have the potential to tolerate intense rehabilitation      Barriers to Discharge        Equipment Recommendations  Rolling walker with 5" wheels;3in1 (PT) (Patient reports already ordered - waiting delivery)    Recommendations for Other Services     Frequency 7X/week    Precautions / Restrictions Precautions Precautions: Knee Precaution Booklet Issued: Yes (comment) Precaution Comments: Reviewed precautions with patient and his daughters. Restrictions Weight Bearing Restrictions: Yes LLE Weight Bearing: Weight bearing as tolerated   Pertinent Vitals/Pain       Mobility  Bed Mobility Overal bed mobility: Needs Assistance Bed Mobility: Supine to Sit Supine to sit: Min assist General bed mobility comments: Verbal cues for technique.  Assist to move LLE off of bed.  Once upright, patient with good balance. Transfers Overall transfer level: Needs assistance Equipment used: Rolling walker (2 wheeled) Transfers: Sit to/from Stand Sit to Stand: Min assist General transfer comment: Verbal cues for hand placement and safe use of RW.  Assist to rise to standing.. Ambulation/Gait Ambulation/Gait assistance: Min assist Ambulation Distance (Feet): 30 Feet Assistive device: Rolling walker (2 wheeled) Gait Pattern/deviations: Step-to pattern;Decreased stance time - left;Decreased step length -  right;Decreased weight shift to left;Antalgic;Trunk flexed    Exercises Total Joint Exercises Ankle Circles/Pumps: AROM;Both;10 reps;Seated   PT Diagnosis: Difficulty walking;Acute pain  PT Problem List: Decreased strength;Decreased range of motion;Decreased activity tolerance;Decreased balance;Decreased mobility;Decreased knowledge of use of DME;Decreased knowledge of precautions;Pain PT Treatment Interventions: DME instruction;Gait training;Functional mobility training;Therapeutic exercise;Patient/family education     PT Goals(Current goals can be found in the care plan section) Acute Rehab PT Goals Patient Stated Goal: To go home tomorrow PT Goal Formulation: With patient/family Time For Goal Achievement: 05/23/13 Potential to Achieve Goals: Good  Visit Information  Last PT Received On: 05/16/13 Assistance Needed: +1 History of Present Illness: Patient is a 58 yo male s/p Lt TKA.       Prior Functioning  Home Living Family/patient expects to be discharged to:: Private residence Living Arrangements: Spouse/significant other Available Help at Discharge: Family;Available 24 hours/day Type of Home: House Home Access: Ramped entrance Home Layout: One level Home Equipment: None Prior Function Level of Independence: Independent Communication Communication: No difficulties    Cognition  Cognition Arousal/Alertness: Awake/alert Behavior During Therapy: WFL for tasks assessed/performed Overall Cognitive Status: Within Functional Limits for tasks assessed    Extremity/Trunk Assessment Upper Extremity Assessment Upper Extremity Assessment: Overall WFL for tasks assessed Lower Extremity Assessment Lower Extremity Assessment: LLE deficits/detail LLE Deficits / Details: Decreased strength and ROM due to surgery/pain.  Able to assist with moving LLE off of bed. LLE: Unable to fully assess due to pain Cervical / Trunk Assessment Cervical / Trunk Assessment: Normal   Balance  Balance Overall balance assessment: Needs assistance Sitting-balance support: No upper extremity supported;Feet supported Sitting balance-Leahy Scale: Good Standing balance support: Bilateral upper extremity supported Standing balance-Leahy Scale: Poor  End of Session PT - End of Session Equipment Utilized During Treatment: Gait belt Activity Tolerance: Patient tolerated treatment well Patient left: in chair;with call bell/phone within reach;with family/visitor present Nurse Communication: Mobility status CPM Left Knee CPM Left Knee: Off (Off at 14:50)  GP     Vena Austria 05/16/2013, 7:42 PM Durenda Hurt. Renaldo Fiddler, [facility] Acute Rehab Services Pager [phone]

## 2013-05-16 NOTE — Plan of Care (Signed)
Problem: Consults Goal: Diagnosis- Total Joint Replacement Primary Total Knee Left     

## 2013-05-17 LAB — BASIC METABOLIC PANEL
BUN: 15 mg/dL (ref 6–23)
CALCIUM: 8.6 mg/dL (ref 8.4–10.5)
CO2: 28 mEq/L (ref 19–32)
Chloride: 101 mEq/L (ref 96–112)
Creatinine, Ser: 0.88 mg/dL (ref 0.50–1.35)
Glucose, Bld: 129 mg/dL — ABNORMAL HIGH (ref 70–99)
Potassium: 4.8 mEq/L (ref 3.7–5.3)
SODIUM: 140 meq/L (ref 137–147)

## 2013-05-17 LAB — CBC
HCT: 36 % — ABNORMAL LOW (ref 39.0–52.0)
Hemoglobin: 12.1 g/dL — ABNORMAL LOW (ref 13.0–17.0)
MCH: 30.2 pg (ref 26.0–34.0)
MCHC: 33.6 g/dL (ref 30.0–36.0)
MCV: 89.8 fL (ref 78.0–100.0)
PLATELETS: 219 10*3/uL (ref 150–400)
RBC: 4.01 MIL/uL — AB (ref 4.22–5.81)
RDW: 13.9 % (ref 11.5–15.5)
WBC: 16.5 10*3/uL — ABNORMAL HIGH (ref 4.0–10.5)

## 2013-05-17 MED ORDER — METHOCARBAMOL 500 MG PO TABS: 500.0000 mg | ORAL_TABLET | Freq: Four times a day (QID) | ORAL | Status: DC | PRN

## 2013-05-17 MED ORDER — ENOXAPARIN SODIUM 40 MG/0.4ML ~~LOC~~ SOLN: 40.0000 mg | SUBCUTANEOUS | Status: DC

## 2013-05-17 MED ORDER — OXYCODONE HCL 5 MG PO TABS: 5.0000 mg | ORAL_TABLET | ORAL | Status: DC | PRN

## 2013-05-17 NOTE — Evaluation (Signed)
Occupational Therapy Evaluation Patient Details Name: Eric DoneJeffrey Reilly MRN: [id number] DOB: 1955-06-14 Today's Date: 05/17/2013 Time: 0815-0900 OT Time Calculation (min): 45 min  OT Assessment / Plan / Recommendation History of present illness Patient is a 58 yo male s/p Lt TKA.   Clinical Impression   Pt is overall supervision for mobility and selfcare transfers.  Needs min assist for LB selfcare but feel in a few days he will be modified independent.  Pt's wife will be able to assist as needed.  No further DME needed as pt is having 3:1 delivered from home and they plan on borrowing a tub bench.    OT Assessment  Patient does not need any further OT services    Follow Up Recommendations  No OT follow up       Equipment Recommendations  None recommended by OT (Pt already to receive 3:1 from home health company)          Precautions / Restrictions Precautions Precautions: Knee Precaution Booklet Issued: Yes (comment) Precaution Comments: Reviewed precautions with patient and his daughters. Restrictions Weight Bearing Restrictions: Yes LLE Weight Bearing: Weight bearing as tolerated   Pertinent Vitals/Pain O2 sats 93% on room air, pain 2/10 on the faces scale, repositioned the left knee at end of session.    ADL  Eating/Feeding: Simulated;Independent Where Assessed - Eating/Feeding: Chair Grooming: Simulated;Modified independent Where Assessed - Grooming: Supported standing Upper Body Bathing: Simulated;Set up Where Assessed - Upper Body Bathing: Unsupported sitting Lower Body Bathing: Simulated;Minimal assistance Where Assessed - Lower Body Bathing: Supported sit to stand Upper Body Dressing: Simulated;Set up Where Assessed - Upper Body Dressing: Unsupported sitting Lower Body Dressing: Performed;Minimal assistance Where Assessed - Lower Body Dressing: Supported sit to stand Toilet Transfer: Research scientist (life sciences)erformed;Supervision/safety Toilet Transfer Method: Sit to Teacher, musicstand Toilet  Transfer Equipment: Bedside commode Where Assessed - Engineer, miningToileting Clothing Manipulation and Hygiene: Sit to stand from 3-in-1 or toilet Tub/Shower Transfer: Performed;Supervision/safety Tub/Shower Transfer Method: Science writerAmbulating Tub/Shower Transfer Equipment: CounsellorTransfer tub bench Equipment Used: Rolling walker Transfers/Ambulation Related to ADLs: Pt overall close supervision for mobility using the RW. ADL Comments: Pt is overall min assist for LB selfcare secondary to decreased ability to reach the left foot.  Feel he will not need AE and expect his flexibility to improve quickly where it would not be worth purchasing.      Acute Rehab OT Goals Patient Stated Goal: Pt wants to get better so he can get back to hunting.  Visit Information  Last OT Received On: 05/17/13 Assistance Needed: +1 History of Present Illness: Patient is a 58 yo male s/p Lt TKA.       Prior Functioning     Home Living Family/patient expects to be discharged to:: Private residence Living Arrangements: Spouse/significant other Available Help at Discharge: Family;Available 24 hours/day Type of Home: House Home Access: Ramped entrance Home Layout: One level Home Equipment: None Prior Function Level of Independence: Independent Communication Communication: No difficulties Dominant Hand: Right         Vision/Perception Vision - History Baseline Vision: Wears glasses all the time Patient Visual Report: No change from baseline Vision - Assessment Eye Alignment: Within Functional Limits Vision Assessment: Vision not tested Perception Perception: Within Functional Limits Praxis Praxis: Intact   Cognition  Cognition Arousal/Alertness: Awake/alert Behavior During Therapy: WFL for tasks assessed/performed Overall Cognitive Status: Within Functional Limits for tasks assessed    Extremity/Trunk Assessment Upper Extremity Assessment Upper Extremity Assessment: Overall WFL for tasks assessed Lower Extremity  Assessment Lower Extremity Assessment: Defer  to PT evaluation Cervical / Trunk Assessment Cervical / Trunk Assessment: Normal     Mobility Bed Mobility Overal bed mobility: Modified Independent Bed Mobility: Supine to Sit Supine to sit: Supervision Transfers Overall transfer level: Needs assistance Equipment used: Rolling walker (2 wheeled) Transfers: Sit to/from Stand Sit to Stand: Supervision        Balance Balance Overall balance assessment: Needs assistance Sitting balance-Leahy Scale: Normal Standing balance support: Bilateral upper extremity supported Standing balance-Leahy Scale: Fair   End of Session OT - End of Session Equipment Utilized During Treatment: Rolling walker Activity Tolerance: Patient tolerated treatment well Patient left: in chair;with nursing/sitter in room;with call bell/phone within reach;with family/visitor present Nurse Communication: Mobility status CPM Left Knee CPM Left Knee: Off     Andric Kerce OTR/L 05/17/2013, 9:26 AM

## 2013-05-17 NOTE — Progress Notes (Signed)
Pt given d/c instructions and Rx. Verbalized understanding. IV removed. Pt d/c via w/c in a stable condition. 

## 2013-05-17 NOTE — Care Management Note (Signed)
CARE MANAGEMENT NOTE 05/17/2013  Patient:  Gasca,Damean   Account Number:  [id number]  Date Initiated:  05/17/2013  Documentation initiated by:  Vance PeperBRADY,Alyan Hartline  Subjective/Objective Assessment:   58 yr old male s/p left total knee arthroplasty.     Action/Plan:   CM spoke with patient concerning home Health and DME needs at discharge.Patient preopertively setup with [facility], no changes.Patient has Rollling walker, 3in1 . CPM to be delivered.   Anticipated DC Date:  05/17/2013   Anticipated DC Plan:  HOME W HOME HEALTH SERVICES      DC Planning Services  CM consult      [facility]  [facility]ALTH  DURABLE MEDICAL EQUIPMENT   Choice offered to / List presented to:  C-1 Patient   DME arranged  CPM      DME agency  [employer]     HH arranged  HH-2 PT      [facility]   Status of service:  Completed, signed off Medicare Important Message given?   (If response is "NO", the following Medicare IM given date fields will be blank) Date Medicare IM given:   Date Additional Medicare IM given:    Discharge Disposition:  HOME W HOME HEALTH SERVICES

## 2013-05-17 NOTE — Progress Notes (Signed)
SPORTS MEDICINE AND JOINT REPLACEMENT  Georgena SpurlingStephen Lucey, MD   Altamese CabalMaurice Allard Lightsey, PA-C [address], [place], [place]  [address]                             [phone]   PROGRESS NOTE  Subjective:  negative for Chest Pain  negative for Shortness of Breath  negative for Nausea/Vomiting   negative for Calf Pain  negative for Bowel Movement   Tolerating Diet: yes         Patient reports pain as 3 on 0-10 scale.    Objective: Vital signs in last 24 hours:   Patient Vitals for the past 24 hrs:  BP Temp Temp src Pulse Resp SpO2  05/17/13 0918 - - - 82 - 93 %  05/17/13 0800 - - - - 16 98 %  05/17/13 0519 100/48 mmHg 98.9 F (37.2 C) Oral 67 18 98 %  05/17/13 0400 - - - - 18 98 %  05/17/13 0135 110/53 mmHg 98 F (36.7 C) Oral 68 19 96 %  05/17/13 0000 - - - - 18 95 %  05/16/13 2244 - - - 65 18 95 %  05/16/13 2028 122/69 mmHg 97.7 F (36.5 C) Oral 83 19 98 %  05/16/13 2000 - - - - 19 98 %  05/16/13 1618 135/72 mmHg 97.8 F (36.6 C) - 83 18 96 %  05/16/13 1600 - - - - 14 98 %    @flow {1959:LAST@   Intake/Output from previous day:   01/12 0701 - 01/13 0700 In: 2200 [I.V.:2200] Out: 1810 [Urine:1300; Drains:460]   Intake/Output this shift:   01/13 0701 - 01/13 1900 In: 360 [P.O.:360] Out: 275 [Urine:275]   Intake/Output     01/12 0701 - 01/13 0700 01/13 0701 - 01/14 0700   P.O.  360   I.V. 2200    Total Intake 2200 360   Urine 1300 275   Drains 460    Blood 50    Total Output 1810 275   Net +390 +85           LABORATORY DATA:  Recent Labs  05/16/13 1130 05/17/13 0400  WBC 11.4* 16.5*  HGB 15.5 12.1*  HCT 44.6 36.0*  PLT 222 219    Recent Labs  05/16/13 1130 05/17/13 0400  NA  --  140  K  --  4.8  CL  --  101  CO2  --  28  BUN  --  15  CREATININE 0.78 0.88  GLUCOSE  --  129*  CALCIUM  --  8.6   Lab Results  Component Value Date   INR 0.93 05/04/2013    Examination:  General appearance: alert, cooperative and no distress Extremities:  extremities normal, atraumatic, no cyanosis or edema and Homans sign is negative, no sign of DVT  Wound Exam: clean, dry, intact   Drainage:  None: wound tissue dry  Motor Exam: EHL and FHL Intact  Sensory Exam: Deep Peroneal normal   Assessment:    1 Day Post-Op  Procedure(s) (LRB): LEFT TOTAL KNEE ARTHROPLASTY (Left)  ADDITIONAL DIAGNOSIS:  Active Problems:   S/P total knee arthroplasty  Acute Blood Loss Anemia   Plan: Physical Therapy as ordered Weight Bearing as Tolerated (WBAT)  DVT Prophylaxis:  Lovenox  DISCHARGE PLAN: Home  DISCHARGE NEEDS: HHPT, CPM, Walker and 3-in-1 comode seat         Leeah Politano 05/17/2013, 1:14 PM

## 2013-05-17 NOTE — Progress Notes (Signed)
Physical Therapy Treatment Patient Details Name: Eric Reilly MRN: [id number] DOB: [birth year] Today's Date: 05/17/2013 Time: [id number] PT Time Calculation (min): 38 min  PT Assessment / Plan / Recommendation  History of Present Illness Patient is a 58 yo male s/p Lt TKA.   PT Comments   Pt progressing well with therapy/mobility.  Per pt he anticipates d/c home later today.  Pt able to increase ambulation distance & complete stair training (1 step to simulate home entrance)   Follow Up Recommendations  Home health PT;Supervision/Assistance - 24 hour     Does the patient have the potential to tolerate intense rehabilitation     Barriers to Discharge        Equipment Recommendations  Rolling walker with 5" wheels;3in1 (PT)    Recommendations for Other Services    Frequency 7X/week   Progress towards PT Goals Progress towards PT goals: Progressing toward goals  Plan Current plan remains appropriate    Precautions / Restrictions Precautions Precautions: Knee Precaution Comments: Reviewed precautions with patient and his daughters. Restrictions LLE Weight Bearing: Weight bearing as tolerated       Mobility  Transfers Overall transfer level: Needs assistance Equipment used: Rolling walker (2 wheeled) Transfers: Sit to/from Stand Sit to Stand: Supervision General transfer comment: Cues for hand placement.   Ambulation/Gait Ambulation/Gait assistance: Min guard Ambulation Distance (Feet): 240 Feet Assistive device: Rolling walker (2 wheeled) Gait Pattern/deviations: Step-to pattern;Step-through pattern;Decreased step length - right Gait velocity: decreased Gait velocity interpretation: Below normal speed for age/gender General Gait Details: cues for sequencing, quad activation for knee stability during stance phase, posture, & safe RW advancement.  Pt emerging into step-through gait pattern as distance increased.  Slow & guarded throughout.   Stairs: Yes Stairs  assistance: Min guard Stair Management: Forwards;Step to pattern;With walker Number of Stairs: 1 General stair comments: cues for technique.        PT Goals (current goals can now be found in the care plan section) Acute Rehab PT Goals PT Goal Formulation: With patient/family Time For Goal Achievement: 05/23/13 Potential to Achieve Goals: Good  Visit Information  Last PT Received On: 05/17/13 Assistance Needed: +1 History of Present Illness: Patient is a 58 yo male s/p Lt TKA.    Subjective Data      Cognition  Cognition Arousal/Alertness: Awake/alert Behavior During Therapy: WFL for tasks assessed/performed Overall Cognitive Status: Within Functional Limits for tasks assessed    Balance     End of Session PT - End of Session Activity Tolerance: Patient tolerated treatment well Patient left: with call bell/phone within reach;in bed;in CPM;with family/visitor present Nurse Communication: Mobility status   GP     [name], [name] 05/17/2013, 3:01 PM   [name], PTA [phone] 05/17/2013

## 2013-05-17 NOTE — Discharge Instructions (Signed)
Diet: As you were doing prior to hospitalization   Activity:  Increase activity slowly as tolerated                  No lifting or driving for 6 weeks  Shower:  May shower without a dressing once there is no drainage from your wound.                 Do NOT wash over the wound.                 Dressing:  You may change your dressing on Wednesday                    Then change the dressing daily with sterile 4"x4"s gauze dressing                     And TED hose for knees.  Weight Bearing:  Weight bearing as tolerated as taught in physical therapy.  Use a                                walker or Crutches as instructed.  To prevent constipation: you may use a stool softener such as -               Colace ( over the counter) 100 mg by mouth twice a day                Drink plenty of fluids ( prune juice may be helpful) and high fiber foods                Miralax ( over the counter) for constipation as needed.    Precautions:  If you experience chest pain or shortness of breath - call 911 immediately               For transfer to the hospital emergency department!!               If you develop a fever greater that 101 F, purulent drainage from wound,                             increased redness or drainage from wound, or calf pain -- Call the office.  Follow- Up Appointment:  Please call for an appointment to be seen on 05/31/13                                              [facility] office:  [phone]            [address] [place], [place] [address]                 [facility] Health Physical [facility] [phone]

## 2013-05-18 ENCOUNTER — Encounter (HOSPITAL_COMMUNITY): Payer: Self-pay | Admitting: Orthopedic Surgery

## 2013-05-18 NOTE — Discharge Summary (Signed)
SPORTS MEDICINE & JOINT REPLACEMENT   Georgena Spurling, MD   Altamese Cabal, PA-C [address] [place], [place], [place]  [address]                             [phone]  PATIENT ID: Eric Reilly        MRN:  [id number]          DOB/AGE: 1955-08-13 / 58 y.o. y.o.    DISCHARGE SUMMARY  ADMISSION DATE:    05/16/2013 DISCHARGE DATE:   05/17/13  ADMISSION DIAGNOSIS: osteoarthritis left knee    DISCHARGE DIAGNOSIS:  osteoarthritis left knee    ADDITIONAL DIAGNOSIS: Active Problems:   S/P total knee arthroplasty  Past Medical History  Diagnosis Date  . Hypertension   . Sleep apnea   . Arthritis     osteoarthritis, left knee  . Bruises easily     PROCEDURE: Procedure(s): LEFT TOTAL KNEE ARTHROPLASTY on 05/16/2013  CONSULTS:     HISTORY:  See H&P in chart  HOSPITAL COURSE:  Eric Reilly is a 58 y.o. admitted on 05/16/2013 and found to have a diagnosis of osteoarthritis left knee.  After appropriate laboratory studies were obtained  they were taken to the operating room on 05/16/2013 and underwent Procedure(s): LEFT TOTAL KNEE ARTHROPLASTY.   They were given perioperative antibiotics:  Anti-infectives   Start     Dose/Rate Route Frequency Ordered Stop   05/16/13 1115  ceFAZolin (ANCEF) IVPB 2 g/50 mL premix     2 g 100 mL/hr over 30 Minutes Intravenous Every 6 hours 05/16/13 1056 05/16/13 1905   05/16/13 0600  ceFAZolin (ANCEF) 3 g in dextrose 5 % 50 mL IVPB     3 g 160 mL/hr over 30 Minutes Intravenous On call to O.R. 05/15/13 1324 05/16/13 0742    .  Tolerated the procedure well.  Placed with a foley intraoperatively.  Given Ofirmev at induction and for 48 hours.    POD# 1: Vital signs were stable.  Patient denied Chest pain, shortness of breath, or calf pain.  Patient was started on Lovenox 30 mg subcutaneously twice daily at 8am.  Consults to PT, OT, and care management were made.  The patient was weight bearing as tolerated.  CPM was placed on the operative leg 0-90  degrees for 6-8 hours a day.  Incentive spirometry was taught.  Dressing was changed.  Marcaine pump and hemovac were discontinued.      POD #2, Continued  PT for ambulation and exercise program.  IV saline locked.  O2 discontinued.    The remainder of the hospital course was dedicated to ambulation and strengthening.   The patient was discharged on 1 day post op in  Good condition.  Blood products given:none  DIAGNOSTIC STUDIES: Recent vital signs: Patient Vitals for the past 24 hrs:  BP Temp Pulse Resp SpO2  05/17/13 1600 - - - 16 98 %  05/17/13 1522 121/65 mmHg 97.6 F (36.4 C) 79 18 98 %       Recent laboratory studies:  Recent Labs  05/16/13 1130 05/17/13 0400  WBC 11.4* 16.5*  HGB 15.5 12.1*  HCT 44.6 36.0*  PLT 222 219    Recent Labs  05/16/13 1130 05/17/13 0400  NA  --  140  K  --  4.8  CL  --  101  CO2  --  28  BUN  --  15  CREATININE 0.78 0.88  GLUCOSE  --  129*  CALCIUM  --  8.6   Lab Results  Component Value Date   INR 0.93 05/04/2013     Recent Radiographic Studies :  Dg Chest 2 View  05/04/2013   CLINICAL DATA:  Preoperative examination (knee replacement)  EXAM: CHEST  2 VIEW  COMPARISON:  None.  FINDINGS: Normal cardiac silhouette and mediastinal contours. The lungs appear mildly hyperexpanded with mild diffuse slightly nodular thickening of the pulmonary interstitium. No focal airspace opacities. No pleural effusion or pneumothorax. No evidence of edema. No acute osseus abnormalities. Stigmata of DISH within the thoracic spine.  IMPRESSION: Mild lung hyperexpansion and bronchitic change without acute cardiopulmonary disease.   Electronically Signed   By: Simonne ComeJohn  Watts M.D.   On: 05/04/2013 10:19    DISCHARGE INSTRUCTIONS: Discharge Orders   Future Orders Complete By Expires   Call MD / Call 911  As directed    Comments:     If you experience chest pain or shortness of breath, CALL 911 and be transported to the hospital emergency room.  If you  develope a fever above 101 F, pus (white drainage) or increased drainage or redness at the wound, or calf pain, call your surgeon's office.   Change dressing  As directed    Comments:     Change dressing on wednesday, then change the dressing daily with sterile 4 x 4 inch gauze dressing and apply TED hose.   Constipation Prevention  As directed    Comments:     Drink plenty of fluids.  Prune juice may be helpful.  You may use a stool softener, such as Colace (over the counter) 100 mg twice a day.  Use MiraLax (over the counter) for constipation as needed.   CPM  As directed    Comments:     Continuous passive motion machine (CPM):      Use the CPM from 0 to 90 for 6-8 hours per day.      You may increase by 10 per day.  You may break it up into 2 or 3 sessions per day.      Use CPM for 2 weeks or until you are told to stop.   Diet - low sodium heart healthy  As directed    Do not put a pillow under the knee. Place it under the heel.  As directed    Driving restrictions  As directed    Comments:     No driving for 6 weeks   Increase activity slowly as tolerated  As directed    Lifting restrictions  As directed    Comments:     No lifting for 6 weeks   TED hose  As directed    Comments:     Use stockings (TED hose) for 3 weeks on both leg(s).  You may remove them at night for sleeping.      DISCHARGE MEDICATIONS:     Medication List    STOP taking these medications       aspirin 81 MG tablet      TAKE these medications       enoxaparin 40 MG/0.4ML injection  Commonly known as:  LOVENOX  Inject 0.4 mLs (40 mg total) into the skin daily.     lisinopril 5 MG tablet  Commonly known as:  PRINIVIL,ZESTRIL  Take 5 mg by mouth daily.     methocarbamol 500 MG tablet  Commonly known as:  ROBAXIN  Take 1 tablet (500 mg total) by mouth  every 6 (six) hours as needed for muscle spasms.     oxyCODONE 5 MG immediate release tablet  Commonly known as:  Oxy IR/ROXICODONE  Take 1-2  tablets (5-10 mg total) by mouth every 3 (three) hours as needed for breakthrough pain.        FOLLOW UP VISIT:       Follow-up Information   Follow up with Raymon Mutton, MD. Call on 05/31/2013.   Specialty:  Orthopedic Surgery   Contact information:   [address] [place] [address] [phone]       DISPOSITION: HOME   CONDITION:  Good   Eric Reilly 05/18/2013, 12:43 PM

## 2014-04-22 IMAGING — CR DG CHEST 2V
2 series · 2 of 2 positions shown · non-contrast
Comparison: None.

CLINICAL DATA: Preoperative examination (knee replacement)

EXAM:
CHEST  2 VIEW

[w chest pa]
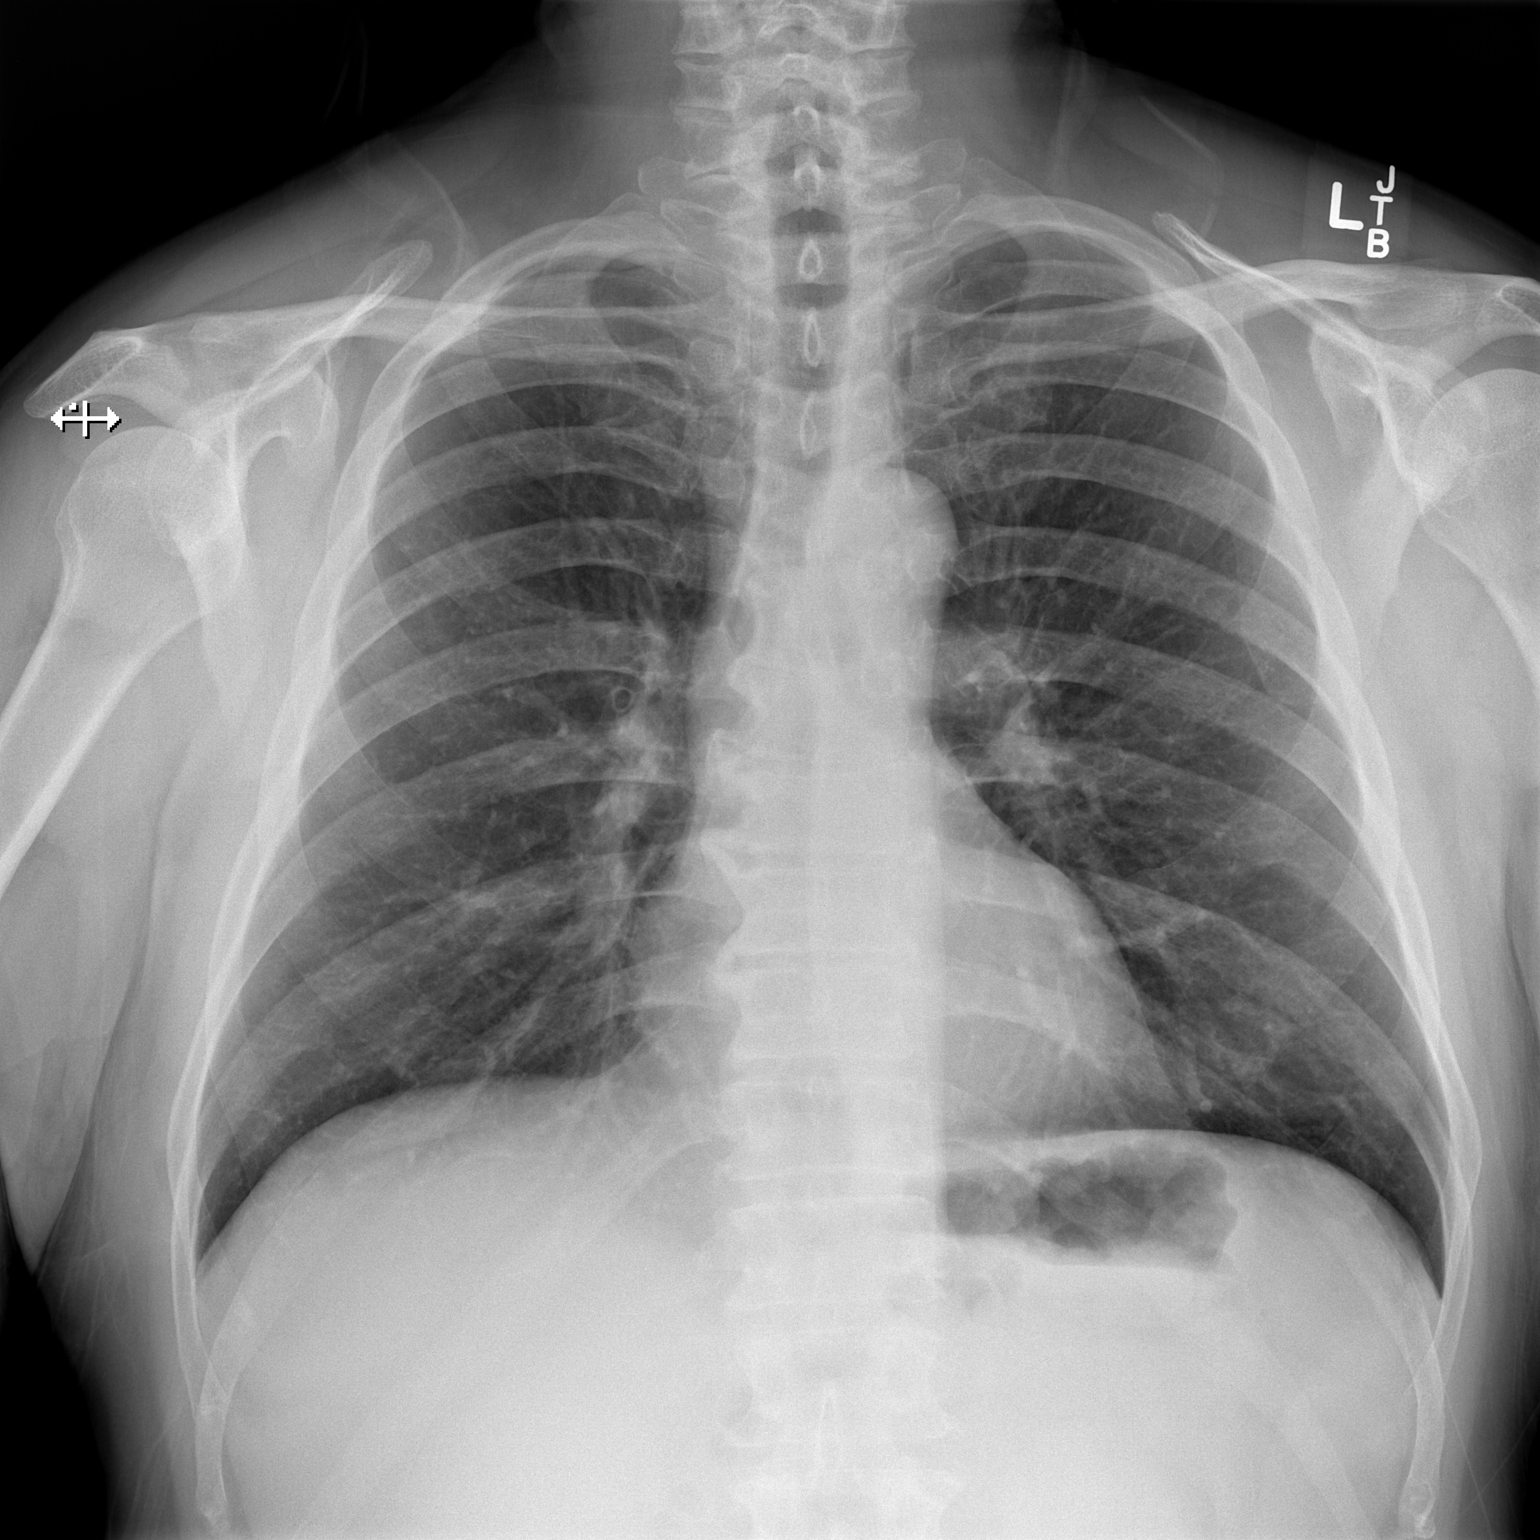

[w chest lat]
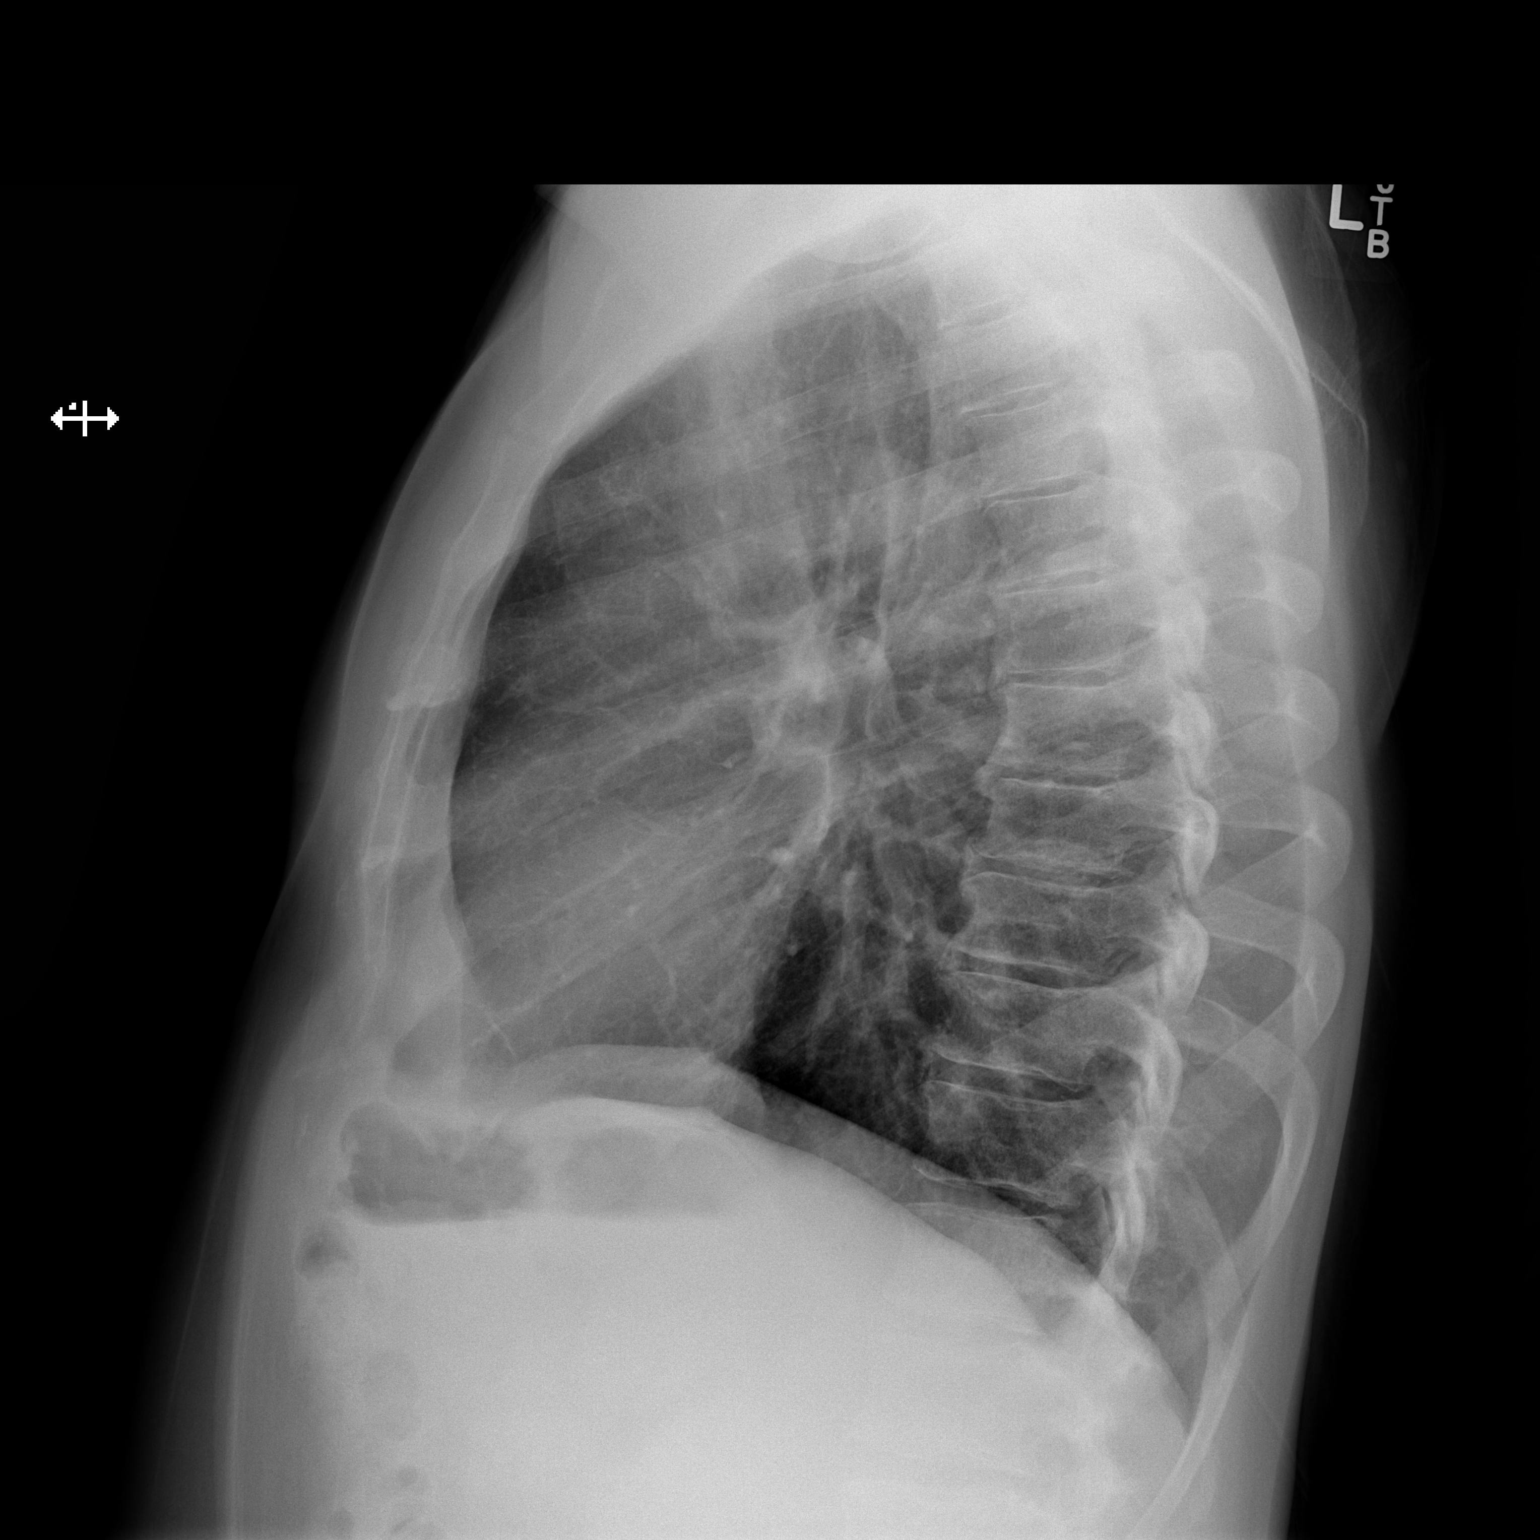

[2 of 2 positions shown; findings below may reference images not displayed]

FINDINGS: Normal cardiac silhouette and mediastinal contours. The lungs appear
mildly hyperexpanded with mild diffuse slightly nodular thickening
of the pulmonary interstitium. No focal airspace opacities. No
pleural effusion or pneumothorax. No evidence of edema. No acute
osseus abnormalities. Stigmata of DISH within the thoracic spine.
IMPRESSION: Mild lung hyperexpansion and bronchitic change without acute
cardiopulmonary disease.

## 2019-08-04 NOTE — Progress Notes (Addendum)
PCP - Mattie Marlin, MD Cardiologist - Lorelei Pont, MD  Chest x-ray -  EKG - 06-27-19 (Chart everywhere) Stress Test -  ECHO - 07-06-19 (pre-op evaluation) Cardiac Cath -   Sleep Study -  CPAP -   Fasting Blood Sugar -  Checks Blood Sugar _____ times a day  Blood Thinner Instructions: Aspirin Instructions: Last Dose:  Anesthesia review:   Patient denies shortness of breath, fever, cough and chest pain at PAT appointment   Patient verbalized understanding of instructions that were given to them at the PAT appointment. Patient was also instructed that they will need to review over the PAT instructions again at home before surgery.

## 2019-08-04 NOTE — Progress Notes (Signed)
Please place surgery orders. Pt is scheduled for her PAT appointment on 08-08-19.

## 2019-08-04 NOTE — Patient Instructions (Addendum)
DUE TO COVID-19 ONLY ONE VISITOR IS ALLOWED TO COME WITH YOU AND STAY IN THE WAITING ROOM ONLY DURING PRE OP AND PROCEDURE DAY OF SURGERY. THE 1 VISITOR MAY VISIT WITH YOU AFTER SURGERY IN YOUR PRIVATE ROOM DURING VISITING HOURS ONLY!  YOU HAD TO HAVE A COVID 19 TEST ON 08-06-19 , PLEASE CONTINUE THE QUARANTINE INSTRUCTIONS AS OUTLINED IN YOUR HANDOUT.                Eric Reilly  08/04/2019   Your procedure is scheduled on: 08-10-19   Report to [facility] Main  Entrance    Report to Admitting at 6:00  AM     Call this number if you have problems the morning of surgery [phone]    Remember:NO SOLID FOOD AFTER MIDNIGHT THE NIGHT PRIOR TO SURGERY. NOTHING BY MOUTH EXCEPT CLEAR LIQUIDS UNTIL 5:30 AM . PLEASE FINISH ENSURE DRINK PER SURGEON ORDER  WHICH NEEDS TO BE COMPLETED AT 5:30 AM.    CLEAR LIQUID DIET   Foods Allowed                                                                     Foods Excluded  Coffee and tea, regular and decaf                             liquids that you cannot  Plain Jell-O any favor except red or purple                                           see through such as: Fruit ices (not with fruit pulp)                                     milk, soups, orange juice  Iced Popsicles                                    All solid food Carbonated beverages, regular and diet                                    Cranberry, grape and apple juices Sports drinks like Gatorade Lightly seasoned clear broth or consume(fat free) Sugar, honey syrup   _____________________________________________________________________    Take these medicines the morning of surgery with A SIP OF WATER: None   BRUSH YOUR TEETH MORNING OF SURGERY AND RINSE YOUR MOUTH OUT, NO CHEWING GUM CANDY OR MINTS.                                You may not have any metal on your body including hair pins and              piercings     Do not wear jewelry, cologne,  lotions, powders or  deodorant  You can bring an overnight bag    Do not bring valuables to the hospital. [place].  Contacts, dentures or bridgework may not be worn into surgery.  You may bring an overnight bag     Special Instructions: N/A              Please read over the following fact sheets you were given: _____________________________________________________________________             [facility] - Preparing for Surgery Before surgery, you can play an important role.  Because skin is not sterile, your skin needs to be as free of germs as possible.  You can reduce the number of germs on your skin by washing with CHG (chlorahexidine gluconate) soap before surgery.  CHG is an antiseptic cleaner which kills germs and bonds with the skin to continue killing germs even after washing. Please DO NOT use if you have an allergy to CHG or antibacterial soaps.  If your skin becomes reddened/irritated stop using the CHG and inform your nurse when you arrive at Short Stay. Do not shave (including legs and underarms) for at least 48 hours prior to the first CHG shower.  You may shave your face/neck. Please follow these instructions carefully:  1.  Shower with CHG Soap the night before surgery and the  morning of Surgery.  2.  If you choose to wash your hair, wash your hair first as usual with your  normal  shampoo.  3.  After you shampoo, rinse your hair and body thoroughly to remove the  shampoo.                           4.  Use CHG as you would any other liquid soap.  You can apply chg directly  to the skin and wash                       Gently with a scrungie or clean washcloth.  5.  Apply the CHG Soap to your body ONLY FROM THE NECK DOWN.   Do not use on face/ open                           Wound or open sores. Avoid contact with eyes, ears mouth and genitals (private parts).                       Wash face,  Genitals (private parts) with your normal soap.              6.  Wash thoroughly, paying special attention to the area where your surgery  will be performed.  7.  Thoroughly rinse your body with warm water from the neck down.  8.  DO NOT shower/wash with your normal soap after using and rinsing off  the CHG Soap.                9.  Pat yourself dry with a clean towel.            10.  Wear clean pajamas.            11.  Place clean sheets on your bed the night of your first shower and do not  sleep with pets. Day of Surgery :  Do not apply any lotions/deodorants the morning of surgery.  Please wear clean clothes to the hospital/surgery center.  FAILURE TO FOLLOW THESE INSTRUCTIONS MAY RESULT IN THE CANCELLATION OF YOUR SURGERY PATIENT SIGNATURE_________________________________  NURSE SIGNATURE__________________________________  ________________________________________________________________________

## 2019-08-05 ENCOUNTER — Ambulatory Visit: Payer: Self-pay | Admitting: Orthopedic Surgery

## 2019-08-06 ENCOUNTER — Other Ambulatory Visit (HOSPITAL_COMMUNITY)
Admission: RE | Admit: 2019-08-06 | Discharge: 2019-08-06 | Disposition: A | Discharge status: Home / Self Care | Payer: No Typology Code available for payment source | Source: Ambulatory Visit | Attending: Orthopedic Surgery | Admitting: Orthopedic Surgery

## 2019-08-06 LAB — SARS CORONAVIRUS 2 (TAT 6-24 HRS): SARS Coronavirus 2: NEGATIVE

## 2019-08-08 ENCOUNTER — Encounter (HOSPITAL_COMMUNITY)
Admission: RE | Admit: 2019-08-08 | Discharge: 2019-08-08 | Disposition: A | Discharge status: Home / Self Care | Payer: BC Managed Care – PPO | Source: Ambulatory Visit | Attending: Orthopedic Surgery | Admitting: Orthopedic Surgery

## 2019-08-08 ENCOUNTER — Other Ambulatory Visit: Payer: Self-pay

## 2019-08-08 ENCOUNTER — Encounter (HOSPITAL_COMMUNITY): Payer: Self-pay

## 2019-08-08 HISTORY — DX: Other specified postprocedural states: Z98.890

## 2019-08-08 HISTORY — DX: Other complications of anesthesia, initial encounter: T88.59XA

## 2019-08-08 HISTORY — DX: Other specified postprocedural states: R11.2

## 2019-08-09 ENCOUNTER — Encounter (HOSPITAL_COMMUNITY)
Admission: RE | Admit: 2019-08-09 | Discharge: 2019-08-09 | Disposition: A | Discharge status: Home / Self Care | Payer: No Typology Code available for payment source | Source: Ambulatory Visit | Attending: Orthopedic Surgery | Admitting: Orthopedic Surgery

## 2019-08-09 ENCOUNTER — Other Ambulatory Visit: Payer: Self-pay

## 2019-08-09 LAB — URINALYSIS, ROUTINE W REFLEX MICROSCOPIC
Bilirubin Urine: NEGATIVE
Glucose, UA: NEGATIVE mg/dL
Hgb urine dipstick: NEGATIVE
Ketones, ur: NEGATIVE mg/dL
Leukocytes,Ua: NEGATIVE
Nitrite: NEGATIVE
Protein, ur: NEGATIVE mg/dL
Specific Gravity, Urine: 1.008 (ref 1.005–1.030)
pH: 6 (ref 5.0–8.0)

## 2019-08-09 LAB — COMPREHENSIVE METABOLIC PANEL
ALT: 27 U/L (ref 0–44)
AST: 28 U/L (ref 15–41)
Albumin: 4.2 g/dL (ref 3.5–5.0)
Alkaline Phosphatase: 64 U/L (ref 38–126)
Anion gap: 8 (ref 5–15)
BUN: 25 mg/dL — ABNORMAL HIGH (ref 8–23)
CO2: 27 mmol/L (ref 22–32)
Calcium: 8.7 mg/dL — ABNORMAL LOW (ref 8.9–10.3)
Chloride: 104 mmol/L (ref 98–111)
Creatinine, Ser: 1.19 mg/dL (ref 0.61–1.24)
GFR calc Af Amer: >60 mL/min (ref >60)
GFR calc non Af Amer: >60 mL/min (ref >60)
Glucose, Bld: 88 mg/dL (ref 70–99)
Potassium: 4.5 mmol/L (ref 3.5–5.1)
Sodium: 139 mmol/L (ref 135–145)
Total Bilirubin: 0.6 mg/dL (ref 0.3–1.2)
Total Protein: 7.2 g/dL (ref 6.5–8.1)

## 2019-08-09 LAB — CBC
HCT: 43.4 % (ref 39.0–52.0)
Hemoglobin: 13.9 g/dL (ref 13.0–17.0)
MCH: 28.7 pg (ref 26.0–34.0)
MCHC: 32 g/dL (ref 30.0–36.0)
MCV: 89.7 fL (ref 80.0–100.0)
Platelets: 266 10*3/uL (ref 150–400)
RBC: 4.84 MIL/uL (ref 4.22–5.81)
RDW: 13.3 % (ref 11.5–15.5)
WBC: 8.7 10*3/uL (ref 4.0–10.5)
nRBC: 0 % (ref 0.0–0.2)

## 2019-08-09 LAB — PROTIME-INR
INR: 1 (ref 0.8–1.2)
Prothrombin Time: 13.1 seconds (ref 11.4–15.2)

## 2019-08-09 LAB — SURGICAL PCR SCREEN
MRSA, PCR: NEGATIVE
Staphylococcus aureus: NEGATIVE

## 2019-08-09 NOTE — Anesthesia Preprocedure Evaluation (Addendum)
Anesthesia Evaluation  Patient identified by MRN, date of birth, ID band Patient awake    Reviewed: Allergy & Precautions, NPO status , Patient's Chart, lab work & pertinent test results  History of Anesthesia Complications (+) PONV and history of anesthetic complications  Airway Mallampati: II  TM Distance: >3 FB Neck ROM: Full    Dental  (+) Partial Lower, Partial Upper, Dental Advisory Given   Pulmonary sleep apnea , former smoker,    Pulmonary exam normal        Cardiovascular hypertension, Pt. on medications Normal cardiovascular exam     Neuro/Psych negative neurological ROS     GI/Hepatic negative GI ROS, Neg liver ROS,   Endo/Other  negative endocrine ROS  Renal/GU negative Renal ROS     Musculoskeletal negative musculoskeletal ROS (+)   Abdominal   Peds  Hematology negative hematology ROS (+)   Anesthesia Other Findings Day of surgery medications reviewed with the patient.  Reproductive/Obstetrics                            Anesthesia Physical Anesthesia Plan  ASA: III  Anesthesia Plan: Spinal   Post-op Pain Management:  Regional for Post-op pain   Induction:   PONV Risk Score and Plan: 2 and Ondansetron and Propofol infusion  Airway Management Planned: Natural Airway, Simple Face Mask and Nasal Cannula  Additional Equipment:   Intra-op Plan:   Post-operative Plan:   Informed Consent: I have reviewed the patients History and Physical, chart, labs and discussed the procedure including the risks, benefits and alternatives for the proposed anesthesia with the patient or authorized representative who has indicated his/her understanding and acceptance.     Dental advisory given  Plan Discussed with: Anesthesiologist and CRNA  Anesthesia Plan Comments:        Anesthesia Quick Evaluation

## 2019-08-10 ENCOUNTER — Inpatient Hospital Stay (HOSPITAL_COMMUNITY): Payer: No Typology Code available for payment source

## 2019-08-10 ENCOUNTER — Inpatient Hospital Stay (HOSPITAL_COMMUNITY): Payer: No Typology Code available for payment source | Admitting: Physician Assistant

## 2019-08-10 ENCOUNTER — Other Ambulatory Visit: Payer: Self-pay

## 2019-08-10 ENCOUNTER — Inpatient Hospital Stay (HOSPITAL_COMMUNITY): Payer: No Typology Code available for payment source | Admitting: Anesthesiology

## 2019-08-10 ENCOUNTER — Encounter (HOSPITAL_COMMUNITY): Payer: Self-pay | Admitting: Orthopedic Surgery

## 2019-08-10 ENCOUNTER — Inpatient Hospital Stay (HOSPITAL_COMMUNITY)
Admission: RE | Admit: 2019-08-10 | Discharge: 2019-08-11 | DRG: 470 | Disposition: A | Discharge status: Home / Self Care | Payer: No Typology Code available for payment source | Attending: Orthopedic Surgery | Admitting: Orthopedic Surgery

## 2019-08-10 ENCOUNTER — Encounter (HOSPITAL_COMMUNITY)
Admission: RE | Disposition: A | Discharge status: Home / Self Care | Payer: Self-pay | Source: Home / Self Care | Attending: Orthopedic Surgery

## 2019-08-10 DIAGNOSIS — Z8249 Family history of ischemic heart disease and other diseases of the circulatory system: Secondary | ICD-10-CM | POA: Diagnosis not present

## 2019-08-10 DIAGNOSIS — Z823 Family history of stroke: Secondary | ICD-10-CM | POA: Diagnosis not present

## 2019-08-10 DIAGNOSIS — I10 Essential (primary) hypertension: Secondary | ICD-10-CM | POA: Diagnosis present

## 2019-08-10 DIAGNOSIS — Z96652 Presence of left artificial knee joint: Secondary | ICD-10-CM | POA: Diagnosis present

## 2019-08-10 DIAGNOSIS — Z833 Family history of diabetes mellitus: Secondary | ICD-10-CM

## 2019-08-10 DIAGNOSIS — G4730 Sleep apnea, unspecified: Secondary | ICD-10-CM | POA: Diagnosis present

## 2019-08-10 DIAGNOSIS — M1711 Unilateral primary osteoarthritis, right knee: Secondary | ICD-10-CM | POA: Diagnosis present

## 2019-08-10 DIAGNOSIS — Z87891 Personal history of nicotine dependence: Secondary | ICD-10-CM | POA: Diagnosis not present

## 2019-08-10 DIAGNOSIS — Z20822 Contact with and (suspected) exposure to covid-19: Secondary | ICD-10-CM | POA: Diagnosis present

## 2019-08-10 DIAGNOSIS — Z820 Family history of epilepsy and other diseases of the nervous system: Secondary | ICD-10-CM

## 2019-08-10 DIAGNOSIS — Z96651 Presence of right artificial knee joint: Secondary | ICD-10-CM

## 2019-08-10 HISTORY — PX: KNEE ARTHROPLASTY: SHX992

## 2019-08-10 LAB — TYPE AND SCREEN
ABO/RH(D): B POS
Antibody Screen: NEGATIVE

## 2019-08-10 LAB — ABO/RH: ABO/RH(D): B POS

## 2019-08-10 SURGERY — ARTHROPLASTY, KNEE, TOTAL, USING IMAGELESS COMPUTER-ASSISTED NAVIGATION
Anesthesia: Spinal | Site: Knee | Laterality: Right

## 2019-08-10 MED ORDER — VITAMIN D 25 MCG (1000 UNIT) PO TABS
5000.0000 [IU] | ORAL_TABLET | Freq: Every day | ORAL | Status: DC
  Administered 2019-08-10 – 2019-08-11 (×2): 5000 [IU] via ORAL
  Filled 2019-08-10 (×2): qty 5

## 2019-08-10 MED ORDER — PHENYLEPHRINE HCL-NACL 10-0.9 MG/250ML-% IV SOLN
INTRAVENOUS | Status: DC | PRN
  Administered 2019-08-10: 30 ug/min via INTRAVENOUS

## 2019-08-10 MED ORDER — FENTANYL CITRATE (PF) 100 MCG/2ML IJ SOLN: 25.0000 ug | INTRAMUSCULAR | Status: DC | PRN

## 2019-08-10 MED ORDER — HYDROCODONE-ACETAMINOPHEN 5-325 MG PO TABS
ORAL_TABLET | ORAL | Status: AC
  Administered 2019-08-10: 13:00:00 1 via ORAL
  Filled 2019-08-10: qty 1

## 2019-08-10 MED ORDER — CEFAZOLIN SODIUM-DEXTROSE 2-4 GM/100ML-% IV SOLN
2.0000 g | INTRAVENOUS | Status: AC
  Administered 2019-08-10: 2 g via INTRAVENOUS
  Filled 2019-08-10: qty 100

## 2019-08-10 MED ORDER — SENNA 8.6 MG PO TABS
1.0000 | ORAL_TABLET | Freq: Two times a day (BID) | ORAL | Status: DC
  Administered 2019-08-10 – 2019-08-11 (×2): 8.6 mg via ORAL
  Filled 2019-08-10 (×2): qty 1

## 2019-08-10 MED ORDER — BUPIVACAINE IN DEXTROSE 0.75-8.25 % IT SOLN
INTRATHECAL | Status: DC | PRN
  Administered 2019-08-10: 2 mL via INTRATHECAL

## 2019-08-10 MED ORDER — CELECOXIB 200 MG PO CAPS
200.0000 mg | ORAL_CAPSULE | Freq: Once | ORAL | Status: AC
  Administered 2019-08-10: 07:00:00 200 mg via ORAL
  Filled 2019-08-10: qty 1

## 2019-08-10 MED ORDER — BUPIVACAINE HCL (PF) 0.25 % IJ SOLN
INTRAMUSCULAR | Status: AC
  Filled 2019-08-10: qty 30

## 2019-08-10 MED ORDER — PROPOFOL 1000 MG/100ML IV EMUL
INTRAVENOUS | Status: AC
  Filled 2019-08-10: qty 100

## 2019-08-10 MED ORDER — CELECOXIB 200 MG PO CAPS
200.0000 mg | ORAL_CAPSULE | Freq: Two times a day (BID) | ORAL | Status: DC
  Administered 2019-08-10 – 2019-08-11 (×2): 200 mg via ORAL
  Filled 2019-08-10 (×2): qty 1

## 2019-08-10 MED ORDER — BUPIVACAINE-EPINEPHRINE 0.25% -1:200000 IJ SOLN
INTRAMUSCULAR | Status: DC | PRN
  Administered 2019-08-10: 30 mL

## 2019-08-10 MED ORDER — ISOPROPYL ALCOHOL 70 % SOLN
Status: AC
  Filled 2019-08-10: qty 480

## 2019-08-10 MED ORDER — PROPOFOL 10 MG/ML IV BOLUS
INTRAVENOUS | Status: DC | PRN
  Administered 2019-08-10: 10 mg via INTRAVENOUS
  Administered 2019-08-10: 20 mg via INTRAVENOUS

## 2019-08-10 MED ORDER — HYDROCODONE-ACETAMINOPHEN 5-325 MG PO TABS
1.0000 | ORAL_TABLET | ORAL | Status: DC | PRN
  Administered 2019-08-10 – 2019-08-11 (×4): 2 via ORAL
  Filled 2019-08-10 (×4): qty 2

## 2019-08-10 MED ORDER — POVIDONE-IODINE 10 % EX SWAB
2.0000 "application " | Freq: Once | CUTANEOUS | Status: AC
  Administered 2019-08-10: 2 via TOPICAL

## 2019-08-10 MED ORDER — FENTANYL CITRATE (PF) 100 MCG/2ML IJ SOLN
INTRAMUSCULAR | Status: DC | PRN
  Administered 2019-08-10 (×2): 50 ug via INTRAVENOUS

## 2019-08-10 MED ORDER — ROPIVACAINE HCL 7.5 MG/ML IJ SOLN
INTRAMUSCULAR | Status: DC | PRN
  Administered 2019-08-10: 20 mL via PERINEURAL

## 2019-08-10 MED ORDER — EPHEDRINE 5 MG/ML INJ
INTRAVENOUS | Status: AC
  Filled 2019-08-10: qty 10

## 2019-08-10 MED ORDER — MENTHOL 3 MG MT LOZG
1.0000 | LOZENGE | OROMUCOSAL | Status: DC | PRN
  Administered 2019-08-11: 02:00:00 3 mg via ORAL
  Filled 2019-08-10: qty 9

## 2019-08-10 MED ORDER — METHOCARBAMOL 500 MG IVPB - SIMPLE MED
500.0000 mg | Freq: Four times a day (QID) | INTRAVENOUS | Status: DC | PRN
  Filled 2019-08-10: qty 50

## 2019-08-10 MED ORDER — PHENYLEPHRINE HCL (PRESSORS) 10 MG/ML IV SOLN
INTRAVENOUS | Status: AC
  Filled 2019-08-10: qty 1

## 2019-08-10 MED ORDER — PROPOFOL 500 MG/50ML IV EMUL
INTRAVENOUS | Status: DC | PRN
  Administered 2019-08-10: 125 ug/kg/min via INTRAVENOUS

## 2019-08-10 MED ORDER — METOCLOPRAMIDE HCL 5 MG PO TABS: 5.0000 mg | ORAL_TABLET | Freq: Three times a day (TID) | ORAL | Status: DC | PRN

## 2019-08-10 MED ORDER — VITAMIN D 25 MCG (1000 UNIT) PO TABS
1000.0000 [IU] | ORAL_TABLET | Freq: Every day | ORAL | Status: DC
  Administered 2019-08-10 – 2019-08-11 (×2): 1000 [IU] via ORAL
  Filled 2019-08-10 (×2): qty 1

## 2019-08-10 MED ORDER — EPHEDRINE SULFATE-NACL 50-0.9 MG/10ML-% IV SOSY
PREFILLED_SYRINGE | INTRAVENOUS | Status: DC | PRN
  Administered 2019-08-10: 10 mg via INTRAVENOUS

## 2019-08-10 MED ORDER — ACETAMINOPHEN 500 MG PO TABS: 1000.0000 mg | ORAL_TABLET | Freq: Once | ORAL | Status: DC

## 2019-08-10 MED ORDER — ASCORBIC ACID 500 MG PO TABS
2000.0000 mg | ORAL_TABLET | Freq: Every day | ORAL | Status: DC
  Administered 2019-08-10 – 2019-08-11 (×2): 2000 mg via ORAL
  Filled 2019-08-10 (×2): qty 4

## 2019-08-10 MED ORDER — PROPOFOL 10 MG/ML IV BOLUS
INTRAVENOUS | Status: AC
  Filled 2019-08-10: qty 20

## 2019-08-10 MED ORDER — ONDANSETRON HCL 4 MG/2ML IJ SOLN: 4.0000 mg | Freq: Four times a day (QID) | INTRAMUSCULAR | Status: DC | PRN

## 2019-08-10 MED ORDER — LISINOPRIL-HYDROCHLOROTHIAZIDE 20-12.5 MG PO TABS: 2.0000 | ORAL_TABLET | Freq: Every day | ORAL | Status: DC

## 2019-08-10 MED ORDER — SODIUM CHLORIDE (PF) 0.9 % IJ SOLN
INTRAMUSCULAR | Status: AC
  Filled 2019-08-10: qty 50

## 2019-08-10 MED ORDER — DEXAMETHASONE SODIUM PHOSPHATE 10 MG/ML IJ SOLN
10.0000 mg | Freq: Once | INTRAMUSCULAR | Status: AC
  Administered 2019-08-11: 11:00:00 10 mg via INTRAVENOUS
  Filled 2019-08-10: qty 1

## 2019-08-10 MED ORDER — DEXAMETHASONE SODIUM PHOSPHATE 10 MG/ML IJ SOLN
INTRAMUSCULAR | Status: AC
  Filled 2019-08-10: qty 1

## 2019-08-10 MED ORDER — TRANEXAMIC ACID-NACL 1000-0.7 MG/100ML-% IV SOLN
1000.0000 mg | INTRAVENOUS | Status: AC
  Administered 2019-08-10: 1000 mg via INTRAVENOUS
  Filled 2019-08-10: qty 100

## 2019-08-10 MED ORDER — DOCUSATE SODIUM 100 MG PO CAPS
100.0000 mg | ORAL_CAPSULE | Freq: Two times a day (BID) | ORAL | Status: DC
  Administered 2019-08-10 – 2019-08-11 (×2): 100 mg via ORAL
  Filled 2019-08-10: qty 1

## 2019-08-10 MED ORDER — METHOCARBAMOL 500 MG IVPB - SIMPLE MED
INTRAVENOUS | Status: AC
  Administered 2019-08-10: 13:00:00 500 mg via INTRAVENOUS
  Filled 2019-08-10: qty 50

## 2019-08-10 MED ORDER — LISINOPRIL 20 MG PO TABS
40.0000 mg | ORAL_TABLET | Freq: Every day | ORAL | Status: DC
  Administered 2019-08-11: 40 mg via ORAL
  Filled 2019-08-10: qty 2

## 2019-08-10 MED ORDER — LACTATED RINGERS IV SOLN: INTRAVENOUS | Status: DC

## 2019-08-10 MED ORDER — STERILE WATER FOR IRRIGATION IR SOLN
Status: DC | PRN
  Administered 2019-08-10: 2000 mL

## 2019-08-10 MED ORDER — SODIUM CHLORIDE 0.9 % IV SOLN: INTRAVENOUS | Status: DC

## 2019-08-10 MED ORDER — MIDAZOLAM HCL 2 MG/2ML IJ SOLN
INTRAMUSCULAR | Status: DC | PRN
  Administered 2019-08-10: 2 mg via INTRAVENOUS

## 2019-08-10 MED ORDER — CEFAZOLIN SODIUM-DEXTROSE 2-4 GM/100ML-% IV SOLN
2.0000 g | Freq: Four times a day (QID) | INTRAVENOUS | Status: AC
  Administered 2019-08-10 (×2): 2 g via INTRAVENOUS
  Filled 2019-08-10 (×2): qty 100

## 2019-08-10 MED ORDER — MORPHINE SULFATE (PF) 2 MG/ML IV SOLN: 0.5000 mg | INTRAVENOUS | Status: DC | PRN

## 2019-08-10 MED ORDER — DEXAMETHASONE SODIUM PHOSPHATE 10 MG/ML IJ SOLN
INTRAMUSCULAR | Status: DC | PRN
  Administered 2019-08-10: 8 mg via INTRAVENOUS

## 2019-08-10 MED ORDER — ACETAMINOPHEN 325 MG PO TABS: 325.0000 mg | ORAL_TABLET | Freq: Four times a day (QID) | ORAL | Status: DC | PRN

## 2019-08-10 MED ORDER — ISOPROPYL ALCOHOL 70 % SOLN
Status: DC | PRN
  Administered 2019-08-10: 1 via TOPICAL

## 2019-08-10 MED ORDER — ALUM & MAG HYDROXIDE-SIMETH 200-200-20 MG/5ML PO SUSP
30.0000 mL | ORAL | Status: DC | PRN
  Administered 2019-08-10: 20:00:00 30 mL via ORAL
  Filled 2019-08-10: qty 30

## 2019-08-10 MED ORDER — SODIUM CHLORIDE (PF) 0.9 % IJ SOLN
INTRAMUSCULAR | Status: DC | PRN
  Administered 2019-08-10: 30 mL

## 2019-08-10 MED ORDER — ONDANSETRON HCL 4 MG/2ML IJ SOLN
INTRAMUSCULAR | Status: DC | PRN
  Administered 2019-08-10: 4 mg via INTRAVENOUS

## 2019-08-10 MED ORDER — ONDANSETRON HCL 4 MG/2ML IJ SOLN
INTRAMUSCULAR | Status: AC
  Filled 2019-08-10: qty 2

## 2019-08-10 MED ORDER — HYDROCHLOROTHIAZIDE 25 MG PO TABS
25.0000 mg | ORAL_TABLET | Freq: Every day | ORAL | Status: DC
  Administered 2019-08-11: 11:00:00 25 mg via ORAL
  Filled 2019-08-10: qty 1

## 2019-08-10 MED ORDER — FENTANYL CITRATE (PF) 100 MCG/2ML IJ SOLN
INTRAMUSCULAR | Status: AC
  Filled 2019-08-10: qty 2

## 2019-08-10 MED ORDER — PROMETHAZINE HCL 25 MG/ML IJ SOLN: 6.2500 mg | INTRAMUSCULAR | Status: DC | PRN

## 2019-08-10 MED ORDER — METHOCARBAMOL 500 MG PO TABS
500.0000 mg | ORAL_TABLET | Freq: Four times a day (QID) | ORAL | Status: DC | PRN
  Administered 2019-08-11: 02:00:00 500 mg via ORAL
  Filled 2019-08-10: qty 1

## 2019-08-10 MED ORDER — POLYETHYLENE GLYCOL 3350 17 G PO PACK: 17.0000 g | PACK | Freq: Every day | ORAL | Status: DC | PRN

## 2019-08-10 MED ORDER — ASPIRIN 81 MG PO CHEW
81.0000 mg | CHEWABLE_TABLET | Freq: Two times a day (BID) | ORAL | Status: DC
  Administered 2019-08-10 – 2019-08-11 (×2): 81 mg via ORAL
  Filled 2019-08-10 (×2): qty 1

## 2019-08-10 MED ORDER — METOCLOPRAMIDE HCL 5 MG/ML IJ SOLN: 5.0000 mg | Freq: Three times a day (TID) | INTRAMUSCULAR | Status: DC | PRN

## 2019-08-10 MED ORDER — ACETAMINOPHEN 10 MG/ML IV SOLN
1000.0000 mg | INTRAVENOUS | Status: AC
  Administered 2019-08-10: 1000 mg via INTRAVENOUS
  Filled 2019-08-10: qty 100

## 2019-08-10 MED ORDER — POVIDONE-IODINE 10 % EX SWAB: 2.0000 "application " | Freq: Once | CUTANEOUS | Status: DC

## 2019-08-10 MED ORDER — DIPHENHYDRAMINE HCL 12.5 MG/5ML PO ELIX: 12.5000 mg | ORAL_SOLUTION | ORAL | Status: DC | PRN

## 2019-08-10 MED ORDER — PHENOL 1.4 % MT LIQD: 1.0000 | OROMUCOSAL | Status: DC | PRN

## 2019-08-10 MED ORDER — BACLOFEN 20 MG PO TABS
20.0000 mg | ORAL_TABLET | Freq: Every day | ORAL | Status: DC
  Administered 2019-08-10: 21:00:00 20 mg via ORAL
  Filled 2019-08-10: qty 1

## 2019-08-10 MED ORDER — SODIUM CHLORIDE 0.9 % IR SOLN
Status: DC | PRN
  Administered 2019-08-10: 1000 mL

## 2019-08-10 MED ORDER — KETOROLAC TROMETHAMINE 30 MG/ML IJ SOLN
INTRAMUSCULAR | Status: DC | PRN
  Administered 2019-08-10: 30 mg

## 2019-08-10 MED ORDER — KETOROLAC TROMETHAMINE 30 MG/ML IJ SOLN
INTRAMUSCULAR | Status: AC
  Filled 2019-08-10: qty 1

## 2019-08-10 MED ORDER — ONDANSETRON HCL 4 MG PO TABS: 4.0000 mg | ORAL_TABLET | Freq: Four times a day (QID) | ORAL | Status: DC | PRN

## 2019-08-10 MED ORDER — HYDROCODONE-ACETAMINOPHEN 7.5-325 MG PO TABS: 1.0000 | ORAL_TABLET | ORAL | Status: DC | PRN

## 2019-08-10 MED ORDER — MIDAZOLAM HCL 2 MG/2ML IJ SOLN
INTRAMUSCULAR | Status: AC
  Filled 2019-08-10: qty 2

## 2019-08-10 SURGICAL SUPPLY — 69 items
BAG ZIPLOCK 12X15 (MISCELLANEOUS) IMPLANT
BATTERY INSTRU NAVIGATION (MISCELLANEOUS) ×6 IMPLANT
BLADE SAW RECIPROCATING 77.5 (BLADE) ×2 IMPLANT
BNDG ELASTIC 4X5.8 VLCR STR LF (GAUZE/BANDAGES/DRESSINGS) ×2 IMPLANT
BNDG ELASTIC 6X5.8 VLCR STR LF (GAUZE/BANDAGES/DRESSINGS) ×2 IMPLANT
CHLORAPREP W/TINT 26 (MISCELLANEOUS) ×4 IMPLANT
COMPONENT TRI CR FEM SZ7 KNEE (Orthopedic Implant) IMPLANT
COVER SURGICAL LIGHT HANDLE (MISCELLANEOUS) ×2 IMPLANT
COVER WAND RF STERILE (DRAPES) IMPLANT
CUFF TOURN SGL QUICK 34 (TOURNIQUET CUFF) ×1
CUFF TRNQT CYL 34X4.125X (TOURNIQUET CUFF) ×1 IMPLANT
DECANTER SPIKE VIAL GLASS SM (MISCELLANEOUS) ×4 IMPLANT
DERMABOND ADVANCED (GAUZE/BANDAGES/DRESSINGS) ×2
DERMABOND ADVANCED .7 DNX12 (GAUZE/BANDAGES/DRESSINGS) ×2 IMPLANT
DRAPE SHEET LG 3/4 BI-LAMINATE (DRAPES) ×6 IMPLANT
DRAPE U-SHAPE 47X51 STRL (DRAPES) ×2 IMPLANT
DRSG AQUACEL AG ADV 3.5X10 (GAUZE/BANDAGES/DRESSINGS) ×2 IMPLANT
DRSG TEGADERM 4X4.75 (GAUZE/BANDAGES/DRESSINGS) IMPLANT
ELECT BLADE TIP CTD 4 INCH (ELECTRODE) ×2 IMPLANT
ELECT REM PT RETURN 15FT ADLT (MISCELLANEOUS) ×2 IMPLANT
EVACUATOR 1/8 PVC DRAIN (DRAIN) IMPLANT
GAUZE SPONGE 4X4 12PLY STRL (GAUZE/BANDAGES/DRESSINGS) ×2 IMPLANT
GLOVE BIO SURGEON STRL SZ8.5 (GLOVE) ×4 IMPLANT
GLOVE BIOGEL PI IND STRL 8.5 (GLOVE) ×1 IMPLANT
GLOVE BIOGEL PI INDICATOR 8.5 (GLOVE) ×1
GOWN SPEC L3 XXLG W/TWL (GOWN DISPOSABLE) ×2 IMPLANT
HANDPIECE INTERPULSE COAX TIP (DISPOSABLE) ×1
HOLDER FOLEY CATH W/STRAP (MISCELLANEOUS) ×2 IMPLANT
HOOD PEEL AWAY FLYTE STAYCOOL (MISCELLANEOUS) ×6 IMPLANT
INSERT TIBIAL KNEE  9 SZ 7 (Insert) ×1 IMPLANT
INSERT TIBIAL KNEE 9 SZ 7 (Insert) IMPLANT
JET LAVAGE IRRISEPT WOUND (IRRIGATION / IRRIGATOR) ×2
KIT TURNOVER KIT A (KITS) IMPLANT
KNEE TIBIAL COMPONENT SZ7 (Knees) ×1 IMPLANT
LAVAGE JET IRRISEPT WOUND (IRRIGATION / IRRIGATOR) ×1 IMPLANT
MARKER SKIN DUAL TIP RULER LAB (MISCELLANEOUS) ×2 IMPLANT
NDL SAFETY ECLIPSE 18X1.5 (NEEDLE) ×1 IMPLANT
NDL SPNL 18GX3.5 QUINCKE PK (NEEDLE) ×1 IMPLANT
NEEDLE HYPO 18GX1.5 SHARP (NEEDLE) ×1
NEEDLE SPNL 18GX3.5 QUINCKE PK (NEEDLE) ×2 IMPLANT
NS IRRIG 1000ML POUR BTL (IV SOLUTION) ×2 IMPLANT
PACK TOTAL KNEE CUSTOM (KITS) ×2 IMPLANT
PADDING CAST COTTON 6X4 STRL (CAST SUPPLIES) ×2 IMPLANT
PATELLA ASYMMETRIC 38X11 KNEE (Orthopedic Implant) ×1 IMPLANT
PENCIL SMOKE EVACUATOR (MISCELLANEOUS) IMPLANT
PIN FLUTED HEDLESS FIX 3.5X1/8 (PIN) ×2 IMPLANT
PROTECTOR NERVE ULNAR (MISCELLANEOUS) ×2 IMPLANT
SAW OSC TIP CART 19.5X105X1.3 (SAW) ×2 IMPLANT
SEALER BIPOLAR AQUA 6.0 (INSTRUMENTS) ×2 IMPLANT
SET HNDPC FAN SPRY TIP SCT (DISPOSABLE) ×1 IMPLANT
SET PAD KNEE POSITIONER (MISCELLANEOUS) ×2 IMPLANT
SPONGE DRAIN TRACH 4X4 STRL 2S (GAUZE/BANDAGES/DRESSINGS) IMPLANT
SUT MNCRL AB 3-0 PS2 18 (SUTURE) ×2 IMPLANT
SUT MNCRL AB 4-0 PS2 18 (SUTURE) ×2 IMPLANT
SUT MON AB 2-0 CT1 36 (SUTURE) ×2 IMPLANT
SUT STRATAFIX PDO 1 14 VIOLET (SUTURE) ×1
SUT STRATFX PDO 1 14 VIOLET (SUTURE) ×1
SUT VIC AB 1 CTX 36 (SUTURE) ×2
SUT VIC AB 1 CTX36XBRD ANBCTR (SUTURE) ×2 IMPLANT
SUT VIC AB 2-0 CT1 27 (SUTURE) ×1
SUT VIC AB 2-0 CT1 TAPERPNT 27 (SUTURE) ×1 IMPLANT
SUTURE STRATFX PDO 1 14 VIOLET (SUTURE) ×1 IMPLANT
SYR 3ML LL SCALE MARK (SYRINGE) ×2 IMPLANT
TOWER CARTRIDGE SMART MIX (DISPOSABLE) IMPLANT
TRAY FOLEY MTR SLVR 16FR STAT (SET/KITS/TRAYS/PACK) IMPLANT
TRI CRUC RET FEM SZ7 KNEE (Orthopedic Implant) ×2 IMPLANT
WATER STERILE IRR 1000ML POUR (IV SOLUTION) ×4 IMPLANT
WRAP KNEE MAXI GEL POST OP (GAUZE/BANDAGES/DRESSINGS) ×2 IMPLANT
YANKAUER SUCT BULB TIP 10FT TU (MISCELLANEOUS) ×2 IMPLANT

## 2019-08-10 NOTE — Transfer of Care (Signed)
Immediate Anesthesia Transfer of Care Note  Patient: Eric Reilly  Procedure(s) Performed: COMPUTER ASSISTED TOTAL KNEE ARTHROPLASTY (Right Knee)  Patient Location: PACU  Anesthesia Type:Spinal  Level of Consciousness: awake, alert  and oriented  Airway & Oxygen Therapy: Patient Spontanous Breathing and Patient connected to face mask oxygen  Post-op Assessment: Report given to RN and Post -op Vital signs reviewed and stable  Post vital signs: Reviewed and stable  Last Vitals:  Vitals Value Taken Time  BP 97/72 08/10/19 1155  Temp    Pulse 67 08/10/19 1156  Resp 20 08/10/19 1156  SpO2 100 % 08/10/19 1156  Vitals shown include unvalidated device data.  Last Pain:  Vitals:   08/10/19 0709  TempSrc: Oral      Patients Stated Pain Goal: 4 (08/10/19 9381)  Complications: No apparent anesthesia complications

## 2019-08-10 NOTE — Anesthesia Procedure Notes (Signed)
Procedure Name: MAC Date/Time: 08/10/2019 8:47 AM Performed by: Niel Hummer, CRNA Pre-anesthesia Checklist: Patient identified, Emergency Drugs available, Suction available and Patient being monitored Oxygen Delivery Method: Simple face mask

## 2019-08-10 NOTE — Anesthesia Procedure Notes (Signed)
Anesthesia Regional Block: Adductor canal block   Pre-Anesthetic Checklist: ,, timeout performed, Correct Patient, Correct Site, Correct Laterality, Correct Procedure, Correct Position, site marked, Risks and benefits discussed,  Surgical consent,  Pre-op evaluation,  At surgeon's request and post-op pain management  Laterality: Right  Prep: chloraprep       Needles:  Injection technique: Single-shot  Needle Type: Stimulator Needle - 80     Needle Length: 10cm  Needle Gauge: 21     Additional Needles:   Narrative:  Start time: 08/10/2019 7:44 AM End time: 08/10/2019 7:54 AM Injection made incrementally with aspirations every 5 mL.  Performed by: Personally

## 2019-08-10 NOTE — Evaluation (Signed)
Physical Therapy Evaluation Patient Details Name: Eric Reilly MRN: [id number] DOB: 05/19/1955 Today's Date: 08/10/2019   History of Present Illness  Patient is 64 y.o. male s/p Rt TKA on 08/10/19 with PMH significant for HTN, OA, sleep apnea, Lt TKA in 2015.  Clinical Impression  Eric Reilly is a 64 y.o. male POD 0 s/p Rt TKA. Patient reports independence with mobility at baseline. Patient is now limited by functional impairments (see PT problem list below) and requires min assist for transfers and gait with RW. Patient was able to ambulate ~55 feet with RW and min assist. Patient instructed in exercise to facilitate ROM and circulation. Patient will benefit from continued skilled PT interventions to address impairments and progress towards PLOF. Acute PT will follow to progress mobility and stair training in preparation for safe discharge home.     Follow Up Recommendations Follow surgeon's recommendation for DC plan and follow-up therapies    Equipment Recommendations  Rolling walker with 5" wheels    Recommendations for Other Services       Precautions / Restrictions Precautions Precautions: Fall Restrictions Weight Bearing Restrictions: No      Mobility  Bed Mobility Overal bed mobility: Needs Assistance Bed Mobility: Supine to Sit     Supine to sit: Min assist;HOB elevated     General bed mobility comments: cues for use of bed rail, assist to pivot to EOB and raise trunk upright  Transfers Overall transfer level: Needs assistance Equipment used: Rolling walker (2 wheeled) Transfers: Sit to/from Stand Sit to Stand: Min assist;From elevated surface         General transfer comment: cues for safe hand placement and technique with RW, assist required for power up and to steady with rising.  Ambulation/Gait Ambulation/Gait assistance: Min assist Gait Distance (Feet): 55 Feet Assistive device: Rolling walker (2 wheeled) Gait Pattern/deviations: Step-to  pattern;Decreased stride length;Decreased stance time - right;Decreased weight shift to right Gait velocity: decreased   General Gait Details: verbal cues for safe step pattern in RW, assist to maintain safe proximity to RW. pt able to prevent Rt LE buckling with support of RW, no overt LOB noted.  Stairs       Wheelchair Mobility    Modified Rankin (Stroke Patients Only)       Balance Overall balance assessment: Needs assistance Sitting-balance support: Feet supported Sitting balance-Leahy Scale: Good     Standing balance support: During functional activity;Bilateral upper extremity supported Standing balance-Leahy Scale: Poor          Pertinent Vitals/Pain Pain Assessment: 0-10 Pain Score: 4  Pain Location: Rt knee Pain Descriptors / Indicators: Aching;Dull;Discomfort;Sore Pain Intervention(s): Limited activity within patient's tolerance;Monitored during session;Repositioned    Home Living Family/patient expects to be discharged to:: Private residence Living Arrangements: Spouse/significant other Available Help at Discharge: Family Type of Home: House Home Access: Stairs to enter Entrance Stairs-Rails: Left Entrance Stairs-Number of Steps: 3 Home Layout: One level Home Equipment: Grab bars - tub/shower      Prior Function Level of Independence: Independent           Hand Dominance   Dominant Hand: Right    Extremity/Trunk Assessment   Upper Extremity Assessment Upper Extremity Assessment: Overall WFL for tasks assessed    Lower Extremity Assessment Lower Extremity Assessment: RLE deficits/detail RLE Deficits / Details: good quad activaiton, no extensor lag with SLR RLE Sensation: WNL RLE Coordination: WNL    Cervical / Trunk Assessment Cervical / Trunk Assessment: Normal  Communication   Communication:  No difficulties  Cognition Arousal/Alertness: Awake/alert Behavior During Therapy: WFL for tasks assessed/performed Overall Cognitive  Status: Within Functional Limits for tasks assessed         General Comments      Exercises Total Joint Exercises Ankle Circles/Pumps: AROM;20 reps;Both;Seated Quad Sets: AROM;Right;5 reps;Seated Heel Slides: AROM;Right;5 reps;Seated   Assessment/Plan    PT Assessment Patient needs continued PT services  PT Problem List Decreased range of motion;Decreased strength;Decreased activity tolerance;Decreased balance;Decreased mobility;Decreased knowledge of use of DME       PT Treatment Interventions DME instruction;Gait training;Stair training;Functional mobility training;Therapeutic activities;Therapeutic exercise;Balance training;Patient/family education    PT Goals (Current goals can be found in the Care Plan section)  Acute Rehab PT Goals Patient Stated Goal: to get back to walking without device PT Goal Formulation: With patient Time For Goal Achievement: 08/17/19 Potential to Achieve Goals: Good    Frequency 7X/week    AM-PAC PT "6 Clicks" Mobility  Outcome Measure Help needed turning from your back to your side while in a flat bed without using bedrails?: A Little Help needed moving from lying on your back to sitting on the side of a flat bed without using bedrails?: A Little Help needed moving to and from a bed to a chair (including a wheelchair)?: A Little Help needed standing up from a chair using your arms (e.g., wheelchair or bedside chair)?: A Little Help needed to walk in hospital room?: A Little Help needed climbing 3-5 steps with a railing? : A Little 6 Click Score: 18    End of Session Equipment Utilized During Treatment: Gait belt Activity Tolerance: Patient tolerated treatment well Patient left: in chair;with call bell/phone within reach;with chair alarm set;with family/visitor present Nurse Communication: Mobility status PT Visit Diagnosis: Muscle weakness (generalized) (M62.81);Difficulty in walking, not elsewhere classified (R26.2)    Time:  2585-2778 PT Time Calculation (min) (ACUTE ONLY): 32 min   Charges:   PT Evaluation $PT Eval Low Complexity: 1 Low PT Treatments $Gait Training: 8-22 mins       Wynn Maudlin, DPT Physical Therapist with [facility] [phone]  08/10/2019 4:07 PM

## 2019-08-10 NOTE — Discharge Instructions (Signed)
° °Dr. Farheen Pfahler °Total Joint Specialist °[facility] °[address] °[place], [place] [address] °[phone] ° °TOTAL KNEE REPLACEMENT POSTOPERATIVE DIRECTIONS ° ° ° °Knee Rehabilitation, Guidelines Following Surgery  °Results after knee surgery are often greatly improved when you follow the exercise, range of motion and muscle strengthening exercises prescribed by your doctor. Safety measures are also important to protect the knee from further injury. Any time any of these exercises cause you to have increased pain or swelling in your knee joint, decrease the amount until you are comfortable again and slowly increase them. If you have problems or questions, call your caregiver or physical therapist for advice.  ° °WEIGHT BEARING °Weight bearing as tolerated with assist device (walker, cane, etc) as directed, use it as long as suggested by your surgeon or therapist, typically at least 4-6 weeks. ° °HOME CARE INSTRUCTIONS  °Remove items at home which could result in a fall. This includes throw rugs or furniture in walking pathways.  °Continue medications as instructed at time of discharge. °You may have some home medications which will be placed on hold until you complete the course of blood thinner medication.  °You may start showering once you are discharged home but do not submerge the incision under water. Just pat the incision dry and apply a dry gauze dressing on daily. °Walk with walker as instructed.  °You may resume a sexual relationship in one month or when given the OK by your doctor.  °· Use walker as long as suggested by your caregivers. °· Avoid periods of inactivity such as sitting longer than an hour when not asleep. This helps prevent blood clots.  °You may put full weight on your legs and walk as much as is comfortable.  °You may return to work once you are cleared by your doctor.  °Do not drive a car for 6 weeks or until released by you surgeon.  °· Do not drive  while taking narcotics.  °Wear the elastic stockings for three weeks following surgery during the day but you may remove then at night. °Make sure you keep all of your appointments after your operation with all of your doctors and caregivers. You should call the office at the above phone number and make an appointment for approximately two weeks after the date of your surgery. °Do not remove your surgical dressing. The dressing is waterproof; you may take showers in 3 days, but do not take tub baths or submerge the dressing. °Please pick up a stool softener and laxative for home use as long as you are requiring pain medications. °· ICE to the affected knee every three hours for 30 minutes at a time and then as needed for pain and swelling.  Continue to use ice on the knee for pain and swelling from surgery. You may notice swelling that will progress down to the foot and ankle.  This is normal after surgery.  Elevate the leg when you are not up walking on it.   °It is important for you to complete the blood thinner medication as prescribed by your doctor. °· Continue to use the breathing machine which will help keep your temperature down.  It is common for your temperature to cycle up and down following surgery, especially at night when you are not up moving around and exerting yourself.  The breathing machine keeps your lungs expanded and your temperature down. ° °RANGE OF MOTION AND STRENGTHENING EXERCISES  °Rehabilitation of the knee is important following   a knee injury or an operation. After just a few days of immobilization, the muscles of the thigh which control the knee become weakened and shrink (atrophy). Knee exercises are designed to build up the tone and strength of the thigh muscles and to improve knee motion. Often times heat used for twenty to thirty minutes before working out will loosen up your tissues and help with improving the range of motion but do not use heat for the first two weeks following  surgery. These exercises can be done on a training (exercise) mat, on the floor, on a table or on a bed. Use what ever works the best and is most comfortable for you Knee exercises include:  °Leg Lifts - While your knee is still immobilized in a splint or cast, you can do straight leg raises. Lift the leg to 60 degrees, hold for 3 sec, and slowly lower the leg. Repeat 10-20 times 2-3 times daily. Perform this exercise against resistance later as your knee gets better.  °Quad and Hamstring Sets - Tighten up the muscle on the front of the thigh (Quad) and hold for 5-10 sec. Repeat this 10-20 times hourly. Hamstring sets are done by pushing the foot backward against an object and holding for 5-10 sec. Repeat as with quad sets.  °A rehabilitation program following serious knee injuries can speed recovery and prevent re-injury in the future due to weakened muscles. Contact your doctor or a physical therapist for more information on knee rehabilitation.  ° °SKILLED REHAB INSTRUCTIONS: °If the patient is transferred to a skilled rehab facility following release from the hospital, a list of the current medications will be sent to the facility for the patient to continue.  When discharged from the skilled rehab facility, please have the facility set up the patient's Home Health Physical Therapy prior to being released. Also, the skilled facility will be responsible for providing the patient with their medications at time of release from the facility to include their pain medication, the muscle relaxants, and their blood thinner medication. If the patient is still at the rehab facility at time of the two week follow up appointment, the skilled rehab facility will also need to assist the patient in arranging follow up appointment in our office and any transportation needs. ° °MAKE SURE YOU:  °Understand these instructions.  °Will watch your condition.  °Will get help right away if you are not doing well or get worse.   ° ° °Pick up stool softner and laxative for home use following surgery while on pain medications. °Do NOT remove your dressing. You may shower.  °Do not take tub baths or submerge incision under water. °May shower starting three days after surgery. °Please use a clean towel to pat the incision dry following showers. °Continue to use ice for pain and swelling after surgery. °Do not use any lotions or creams on the incision until instructed by your surgeon. ° °

## 2019-08-10 NOTE — Anesthesia Procedure Notes (Signed)
Spinal  Patient location during procedure: OR Start time: 08/10/2019 8:48 AM End time: 08/10/2019 8:52 AM Staffing Performed: resident/CRNA  Resident/CRNA: Nelle Don, CRNA Preanesthetic Checklist Completed: patient identified, IV checked, risks and benefits discussed, surgical consent, monitors and equipment checked and pre-op evaluation Spinal Block Patient position: sitting Prep: DuraPrep Patient monitoring: heart rate, continuous pulse ox and blood pressure Approach: midline Location: L3-4 Injection technique: single-shot Needle Needle type: Pencan  Needle gauge: 24 G Needle length: 9 cm

## 2019-08-10 NOTE — Op Note (Signed)
OPERATIVE REPORT  SURGEON: Rod Can, MD   ASSISTANT: Nehemiah Massed, PA-C.  PREOPERATIVE DIAGNOSIS: Right knee arthritis.   POSTOPERATIVE DIAGNOSIS: Right knee arthritis.   PROCEDURE: Right total knee arthroplasty.   IMPLANTS: Stryker Triathlon CR femur, size 7. Stryker Tritanium tibia, size 7. X3 polyethelyene insert, size 9 mm, CR. 3 button asymmetric patella, size 38 mm.  ANESTHESIA:  MAC, Regional and Spinal  TOURNIQUET TIME: Not utilized.   ESTIMATED BLOOD LOSS:-350 mL    ANTIBIOTICS: 2 g Ancef.  DRAINS: None.  COMPLICATIONS: None   CONDITION: PACU - hemodynamically stable.   BRIEF CLINICAL NOTE: Eric Reilly is a 64 y.o. male with a long-standing history of Right knee arthritis. After failing conservative management, the patient was indicated for total knee arthroplasty. The risks, benefits, and alternatives to the procedure were explained, and the patient elected to proceed.  PROCEDURE IN DETAIL: Adductor canal block was obtained in the pre-op holding area. Once inside the operative room, spinal anesthesia was obtained, and a foley catheter was inserted. The patient was then positioned, a nonsterile tourniquet was placed, and the lower extremity was prepped and draped in the normal sterile surgical fashion.  A time-out was called verifying side and site of surgery. The patient received IV antibiotics within 60 minutes of beginning the procedure. The tourniquet was not utilized.   An anterior approach to the knee was performed utilizing a midvastus arthrotomy. A medial release was performed and the patellar fat pad was excised. Stryker navigation was used to cut the distal femur perpendicular to the mechanical axis. A freehand patellar resection was performed, and the patella was sized an prepared with 3 lug holes.  Nagivation was used to make a neutral proximal tibia  resection, taking 9 mm of bone from the less affected lateral side with 4 degrees of slope. The menisci were excised. A spacer block was placed, and the alignment and balance in extension were confirmed.   The distal femur was sized using the 3-degree external rotation guide referencing the posterior femoral cortex. The appropriate 4-in-1 cutting block was pinned into place. Rotation was checked using Whiteside's line, the epicondylar axis, and then confirmed with a spacer block in flexion. The remaining femoral cuts were performed, taking care to protect the MCL.  The tibia was sized and the trial tray was pinned into place. The remaining trail components were inserted. The knee was stable to varus and valgus stress through a full range of motion. The patella tracked centrally, and the PCL was well balanced. The trial components were removed, and the proximal tibial surface was prepared. Final components were impacted into place. The knee was tested for a final time and found to be well balanced.   The wound was copiously irrigated with Irrisept solution and normal saline using pule lavage.  Marcaine solution was injected into the periarticular soft tissue.  The wound was closed in layers using #1 Vicryl and Stratafix for the fascia, 2-0 Vicryl for the subcutaneous fat, 2-0 Monocryl for the deep dermal layer, 3-0 running Monocryl subcuticular Stitch, and 4-0 Monocryl stay sutures at both ends of the wound. Dermabond was applied to the skin.  Once the glue was fully dried, an Aquacell Ag and compressive dressing were applied.  Tthe patient was transported to the recovery room in stable condition.  Sponge, needle, and instrument counts were correct at the end of the case x2.  The patient tolerated the procedure well and there were no known complications.  Please note that a  surgical assistant was a medical necessity for this procedure in order to perform it in a safe and expeditious manner. Surgical assistant  was necessary to retract the ligaments and vital neurovascular structures to prevent injury to them and also necessary for proper positioning of the limb to allow for anatomic placement of the prosthesis.

## 2019-08-10 NOTE — H&P (Signed)
TOTAL KNEE ADMISSION H&P  Patient is being admitted for right total knee arthroplasty.  Subjective:  Chief Complaint:right knee pain.  HPI: Eric Reilly, 64 y.o. male, has a history of pain and functional disability in the right knee due to arthritis and has failed non-surgical conservative treatments for greater than 12 weeks to includeNSAID's and/or analgesics, corticosteriod injections, flexibility and strengthening excercises, supervised PT with diminished ADL's post treatment, use of assistive devices, weight reduction as appropriate and activity modification.  Onset of symptoms was gradual, starting 2 years ago with rapidlly worsening course since that time. The patient noted no past surgery on the right knee(s).  Patient currently rates pain in the right knee(s) at 10 out of 10 with activity. Patient has night pain, worsening of pain with activity and weight bearing, pain that interferes with activities of daily living, pain with passive range of motion, crepitus and joint swelling.  Patient has evidence of subchondral cysts, subchondral sclerosis, periarticular osteophytes and joint space narrowing by imaging studies. There is no active infection.  Patient Active Problem List   Diagnosis Date Noted  . Osteoarthritis of right knee 08/10/2019  . 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 Arthritis    osteoarthritis, left knee  . Bruises easily   . Complication of anesthesia   . Hypertension   . PONV (postoperative nausea and vomiting)   . Sleep apnea     Past Surgical History:  Procedure Laterality Date  . COLONOSCOPY    . KNEE ARTHROSCOPY Left 2009  . TENDON REPAIR Left 2012  . TOTAL KNEE ARTHROPLASTY Left 05/16/2013   Procedure: LEFT TOTAL KNEE ARTHROPLASTY;  Surgeon: Dannielle Huh, MD;  Location: [facility] OR;  Service: Orthopedics;  Laterality: Left;    Current Facility-Administered Medications  Medication Dose Route Frequency Provider Last  Rate Last Admin  . 0.9 %  sodium chloride infusion   Intravenous Continuous Nohelani Benning, Arlys John, MD      . acetaminophen (OFIRMEV) IV 1,000 mg  1,000 mg Intravenous To OR Channing Savich, Arlys John, MD      . acetaminophen (TYLENOL) tablet 1,000 mg  1,000 mg Oral Once Heather Roberts, MD      . ceFAZolin (ANCEF) IVPB 2g/100 mL premix  2 g Intravenous On Call to OR Rachid Parham, Arlys John, MD      . celecoxib (CELEBREX) capsule 200 mg  200 mg Oral Once Heather Roberts, MD      . povidone-iodine 10 % swab 2 application  2 application Topical Once Trini Soldo, Arlys John, MD      . povidone-iodine 10 % swab 2 application  2 application Topical Once Magdalene Tardiff, Arlys John, MD      . tranexamic acid (CYKLOKAPRON) IVPB 1,000 mg  1,000 mg Intravenous To OR Janissa Bertram, Arlys John, MD       No Known Allergies  Social History   Tobacco Use  . Smoking status: Former Smoker    Packs/day: 1.00    Types: Cigarettes    Quit date: 2015    Years since quitting: 6.2  . Smokeless tobacco: Former Neurosurgeon    Types: Chew  Substance Use Topics  . Alcohol use: Yes    Comment: "very little"    Family History  Problem Relation Age of Onset  . Cancer Mother   . Diabetes type II Mother   . Alzheimer's disease Mother   . CVA Father   . Heart disease Father      Review of Systems  Constitutional: Negative.  HENT: Negative.   Eyes: Negative.   Respiratory: Negative.   Cardiovascular: Negative.   Gastrointestinal: Negative.   Endocrine: Negative.   Genitourinary: Negative.   Musculoskeletal: Positive for arthralgias and joint swelling.  Skin: Negative.   Allergic/Immunologic: Negative.   Hematological: Negative.   Psychiatric/Behavioral: Negative.     Objective:  Physical Exam  Vitals reviewed. Constitutional: He is oriented to person, place, and time. He appears well-developed and well-nourished.  HENT:  Head: Normocephalic and atraumatic.  Eyes: Pupils are equal, round, and reactive to light. Conjunctivae and EOM are normal.   Cardiovascular: Normal rate, regular rhythm and intact distal pulses.  Respiratory: Effort normal. No respiratory distress.  GI: Soft. He exhibits no distension.  Genitourinary:    Genitourinary Comments: deferred   Musculoskeletal:     Cervical back: Normal range of motion and neck supple.     Right knee: Swelling and effusion present. Decreased range of motion. Tenderness present over the medial joint line. Abnormal alignment.  Neurological: He is alert and oriented to person, place, and time. He has normal reflexes.  Skin: Skin is warm and dry.  Psychiatric: He has a normal mood and affect. His behavior is normal. Judgment and thought content normal.    Vital signs in last 24 hours: Temp:  [98.9 F (37.2 C)] 98.9 F (37.2 C) (04/06 1511) Pulse Rate:  [63] 63 (04/06 1511) Resp:  [18] 18 (04/06 1511) BP: (142)/(76) 142/76 (04/06 1511) SpO2:  [98 %] 98 % (04/06 1511) Weight:  [110.5 kg] 110.5 kg (04/06 1511)  Labs:   Estimated body mass index is 32.13 kg/m as calculated from the following:   Height as of 08/09/19: 6\' 1"  (1.854 m).   Weight as of 08/09/19: 110.5 kg.   Imaging Review Plain radiographs demonstrate severe degenerative joint disease of the right knee(s). The overall alignment issignificant varus. The bone quality appears to be adequate for age and reported activity level.      Assessment/Plan:  End stage arthritis, right knee   The patient history, physical examination, clinical judgment of the provider and imaging studies are consistent with end stage degenerative joint disease of the right knee(s) and total knee arthroplasty is deemed medically necessary. The treatment options including medical management, injection therapy arthroscopy and arthroplasty were discussed at length. The risks and benefits of total knee arthroplasty were presented and reviewed. The risks due to aseptic loosening, infection, stiffness, patella tracking problems, thromboembolic  complications and other imponderables were discussed. The patient acknowledged the explanation, agreed to proceed with the plan and consent was signed. Patient is being admitted for inpatient treatment for surgery, pain control, PT, OT, prophylactic antibiotics, VTE prophylaxis, progressive ambulation and ADL's and discharge planning. The patient is planning to be discharged home with OPPT   The risks, benefits, and alternatives were discussed with the patient. There are risks associated with the surgery including, but not limited to, problems with anesthesia (death), infection, instability (giving out of the joint), dislocation, differences in leg length/angulation/rotation, fracture of bones, loosening or failure of implants, hematoma (blood accumulation) which may require surgical drainage, blood clots, pulmonary embolism, nerve injury (foot drop and lateral thigh numbness), and blood vessel injury. The patient understands these risks and elects to proceed.    Patient's anticipated LOS is less than 2 midnights, meeting these requirements: - Younger than 48 - Lives within 1 hour of care - Has a competent adult at home to recover with post-op recover - NO history of  - Chronic pain  requiring opiods  - Diabetes  - Coronary Artery Disease  - Heart failure  - Heart attack  - Stroke  - DVT/VTE  - Cardiac arrhythmia  - Respiratory Failure/COPD  - Renal failure  - Anemia  - Advanced Liver disease

## 2019-08-10 NOTE — Interval H&P Note (Signed)
History and Physical Interval Note:  08/10/2019 8:20 AM  Eric Reilly  has presented today for surgery, with the diagnosis of Degenerative joint disease right knee.  The various methods of treatment have been discussed with the patient and family. After consideration of risks, benefits and other options for treatment, the patient has consented to  Procedure(s): COMPUTER ASSISTED TOTAL KNEE ARTHROPLASTY (Right) as a surgical intervention.  The patient's history has been reviewed, patient examined, no change in status, stable for surgery.  I have reviewed the patient's chart and labs.  Questions were answered to the patient's satisfaction.     Iline Oven Sohana Austell

## 2019-08-10 NOTE — Anesthesia Postprocedure Evaluation (Signed)
Anesthesia Post Note  Patient: Eric Reilly  Procedure(s) Performed: COMPUTER ASSISTED TOTAL KNEE ARTHROPLASTY (Right Knee)     Patient location during evaluation: PACU Anesthesia Type: Spinal Level of consciousness: awake and alert Pain management: pain level controlled Vital Signs Assessment: post-procedure vital signs reviewed and stable Respiratory status: spontaneous breathing and respiratory function stable Cardiovascular status: blood pressure returned to baseline and stable Postop Assessment: spinal receding Anesthetic complications: no    Last Vitals:  Vitals:   08/10/19 1230 08/10/19 1245  BP: 102/62 103/66  Pulse: 62 60  Resp: 17 12  Temp:  36.8 C  SpO2: 99% 98%    Last Pain:  Vitals:   08/10/19 1245  TempSrc:   PainSc: 0-No pain                 [name]

## 2019-08-11 ENCOUNTER — Encounter: Payer: Self-pay | Admitting: *Deleted

## 2019-08-11 LAB — BASIC METABOLIC PANEL
Anion gap: 9 (ref 5–15)
Anion gap: 7 (ref 5–15)
BUN: 27 mg/dL — ABNORMAL HIGH (ref 8–23)
BUN: 25 mg/dL — ABNORMAL HIGH (ref 8–23)
CO2: 23 mmol/L (ref 22–32)
CO2: 24 mmol/L (ref 22–32)
Calcium: 7.9 mg/dL — ABNORMAL LOW (ref 8.9–10.3)
Calcium: 8.4 mg/dL — ABNORMAL LOW (ref 8.9–10.3)
Chloride: 104 mmol/L (ref 98–111)
Chloride: 105 mmol/L (ref 98–111)
Creatinine, Ser: 1.4 mg/dL — ABNORMAL HIGH (ref 0.61–1.24)
Creatinine, Ser: 1.33 mg/dL — ABNORMAL HIGH (ref 0.61–1.24)
GFR calc Af Amer: >60 mL/min (ref >60)
GFR calc Af Amer: >60 mL/min (ref >60)
GFR calc non Af Amer: 52 mL/min — ABNORMAL LOW (ref >60)
GFR calc non Af Amer: 55 mL/min — ABNORMAL LOW (ref >60)
Glucose, Bld: 157 mg/dL — ABNORMAL HIGH (ref 70–99)
Glucose, Bld: 142 mg/dL — ABNORMAL HIGH (ref 70–99)
Potassium: 4.3 mmol/L (ref 3.5–5.1)
Potassium: 4.6 mmol/L (ref 3.5–5.1)
Sodium: 136 mmol/L (ref 135–145)
Sodium: 136 mmol/L (ref 135–145)

## 2019-08-11 LAB — CBC
HCT: 32.3 % — ABNORMAL LOW (ref 39.0–52.0)
Hemoglobin: 10.6 g/dL — ABNORMAL LOW (ref 13.0–17.0)
MCH: 29.3 pg (ref 26.0–34.0)
MCHC: 32.8 g/dL (ref 30.0–36.0)
MCV: 89.2 fL (ref 80.0–100.0)
Platelets: 209 10*3/uL (ref 150–400)
RBC: 3.62 MIL/uL — ABNORMAL LOW (ref 4.22–5.81)
RDW: 13.2 % (ref 11.5–15.5)
WBC: 15.4 10*3/uL — ABNORMAL HIGH (ref 4.0–10.5)
nRBC: 0 % (ref 0.0–0.2)

## 2019-08-11 MED ORDER — HYDROCODONE-ACETAMINOPHEN 5-325 MG PO TABS: 1.0000 | ORAL_TABLET | ORAL | 0 refills | Status: AC | PRN

## 2019-08-11 MED ORDER — ONDANSETRON HCL 4 MG PO TABS: 4.0000 mg | ORAL_TABLET | Freq: Four times a day (QID) | ORAL | 0 refills | Status: AC | PRN

## 2019-08-11 MED ORDER — ASPIRIN 81 MG PO CHEW: 81.0000 mg | CHEWABLE_TABLET | Freq: Two times a day (BID) | ORAL | 0 refills | Status: AC

## 2019-08-11 MED ORDER — DOCUSATE SODIUM 100 MG PO CAPS: 100.0000 mg | ORAL_CAPSULE | Freq: Two times a day (BID) | ORAL | 0 refills | Status: AC

## 2019-08-11 MED ORDER — SENNA 8.6 MG PO TABS: 2.0000 | ORAL_TABLET | Freq: Every day | ORAL | 0 refills | Status: AC

## 2019-08-11 NOTE — TOC Transition Note (Signed)
Transition of Care [facility]) - CM/SW Discharge Note   Patient Details  Name: Eric Reilly MRN: [id number] Date of Birth: 06-Aug-1955  [facility] CM/SW Contact:  Clearance Coots, LCSW Phone Number: 08/11/2019, 11:03 AM   Clinical Narrative:    [employer] and [employer] cannot provide RW due to patient need for authorization through [employer]. Patient provided CSW with Case Manager information Linton Ham. CSW reached out to [place], she reports the  RW is to be delivered to the patient home but has not been ordered. The RW will be ordered through [facility].  Patient will  borrow a RW from the 3E unit for a safe discharge. Patient agreeable to the loaner RW and agreeable to return the walker once his is delivered to the home.    Final next level of care: OP Rehab Barriers to Discharge: Barriers Resolved   Patient Goals and CMS Choice     Choice offered to / list presented to : NA  Discharge Placement  N/A                     Discharge Plan and Services                                     Social Determinants of Health (SDOH) Interventions     Readmission Risk Interventions No flowsheet data found.

## 2019-08-11 NOTE — Progress Notes (Signed)
Physical Therapy Treatment Patient Details Name: Eric Reilly MRN: [id number] DOB: 06-Mar-1956 Today's Date: 08/11/2019    History of Present Illness Patient is 64 y.o. male s/p Rt TKA on 08/10/19 with PMH significant for HTN, OA, sleep apnea, Lt TKA in 2015.    PT Comments    Pt ambulated 180' with RW, completed stair training, and demonstrates good understanding of HEP. He is ready to DC home from PT standpoint.   Follow Up Recommendations  Follow surgeon's recommendation for DC plan and follow-up therapies     Equipment Recommendations  Rolling walker with 5" wheels    Recommendations for Other Services       Precautions / Restrictions Precautions Precautions: Fall;Knee Precaution Comments: reviewed no pillow under knee Restrictions Weight Bearing Restrictions: No    Mobility  Bed Mobility Overal bed mobility: Needs Assistance Bed Mobility: Supine to Sit     Supine to sit: HOB elevated;Supervision     General bed mobility comments: cues for use of bed rail  Transfers Overall transfer level: Needs assistance Equipment used: Rolling walker (2 wheeled) Transfers: Sit to/from Stand Sit to Stand: From elevated surface;Supervision         General transfer comment: cues for safe hand placement and technique with RW  Ambulation/Gait Ambulation/Gait assistance: Supervision Gait Distance (Feet): 180 Feet Assistive device: Rolling walker (2 wheeled) Gait Pattern/deviations: Step-to pattern;Decreased stride length;Decreased stance time - right;Decreased weight shift to right Gait velocity: decreased   General Gait Details: no LOB noted, good sequencing with RW   Stairs Stairs: Yes Stairs assistance: Min guard Stair Management: One rail Right;With cane;Step to pattern;Forwards Number of Stairs: 3 General stair comments: VCs sequencing   Wheelchair Mobility    Modified Rankin (Stroke Patients Only)       Balance Overall balance assessment: Needs  assistance Sitting-balance support: Feet supported Sitting balance-Leahy Scale: Good     Standing balance support: During functional activity;Bilateral upper extremity supported Standing balance-Leahy Scale: Fair                              Cognition Arousal/Alertness: Awake/alert Behavior During Therapy: WFL for tasks assessed/performed Overall Cognitive Status: Within Functional Limits for tasks assessed                                        Exercises Total Joint Exercises Ankle Circles/Pumps: AROM;20 reps;Both;Seated Quad Sets: AROM;Right;5 reps;Seated Short Arc Quad: AROM;Right;10 reps;Supine Heel Slides: AROM;Right;10 reps;Supine Hip ABduction/ADduction: AROM;Right;10 reps;Supine Straight Leg Raises: AROM;Right;5 reps;Supine Long Arc Quad: AROM;Right;10 reps;Seated Knee Flexion: AAROM;Right;10 reps;Seated Goniometric ROM: 5-70* R knee AAROM    General Comments        Pertinent Vitals/Pain Pain Score: 3  Pain Location: R knee Pain Descriptors / Indicators: Sore Pain Intervention(s): Limited activity within patient's tolerance;Monitored during session;Premedicated before session;Ice applied    Home Living                      Prior Function            PT Goals (current goals can now be found in the care plan section) Acute Rehab PT Goals Patient Stated Goal: to get back to walking without device PT Goal Formulation: With patient/family Time For Goal Achievement: 08/17/19 Potential to Achieve Goals: Good Progress towards PT goals: Goals met/education completed, patient discharged from PT  Frequency    7X/week      PT Plan Current plan remains appropriate    Co-evaluation              AM-PAC PT "6 Clicks" Mobility   Outcome Measure  Help needed turning from your back to your side while in a flat bed without using bedrails?: None Help needed moving from lying on your back to sitting on the side of a  flat bed without using bedrails?: A Little Help needed moving to and from a bed to a chair (including a wheelchair)?: None Help needed standing up from a chair using your arms (e.g., wheelchair or bedside chair)?: None Help needed to walk in hospital room?: None Help needed climbing 3-5 steps with a railing? : A Little 6 Click Score: 22    End of Session Equipment Utilized During Treatment: Gait belt Activity Tolerance: Patient tolerated treatment well Patient left: in chair;with call bell/phone within reach;with chair alarm set;with family/visitor present Nurse Communication: Mobility status PT Visit Diagnosis: Muscle weakness (generalized) (M62.81);Difficulty in walking, not elsewhere classified (R26.2)     Time: 3532-9924 PT Time Calculation (min) (ACUTE ONLY): 38 min  Charges:  $Gait Training: 8-22 mins $Therapeutic Exercise: 8-22 mins $Therapeutic Activity: 8-22 mins                     Blondell Reveal Kistler PT 08/11/2019  Acute Rehabilitation Services Pager [phone] Office [phone]

## 2019-08-11 NOTE — Progress Notes (Signed)
Pt and pt's Wife provided with d/c instructions. After discussing the pt's plan of care upon d/c home, the pt reported no further questions or concerns.

## 2019-08-11 NOTE — Progress Notes (Signed)
    Subjective:  Patient reports pain as mild to moderate.  Denies N/V/CP/SOB. No c/o  Objective:   VITALS:   Vitals:   08/10/19 2208 08/11/19 0142 08/11/19 0559 08/11/19 0953  BP: 111/61 120/64 103/60 138/67  Pulse: 84 70 60 71  Resp: 19 18 18 16   Temp: 98.4 F (36.9 C) (!) 97.5 F (36.4 C) (!) 97.1 F (36.2 C) 97.8 F (36.6 C)  TempSrc: Oral Axillary Axillary Oral  SpO2: 97% 98% 100% 100%  Weight:      Height:        NAD ABD soft Sensation intact distally Intact pulses distally Dorsiflexion/Plantar flexion intact Incision: dressing C/D/I Compartment soft   Lab Results  Component Value Date   WBC 15.4 (H) 08/11/2019   HGB 10.6 (L) 08/11/2019   HCT 32.3 (L) 08/11/2019   MCV 89.2 08/11/2019   PLT 209 08/11/2019   BMET    Component Value Date/Time   NA 136 08/11/2019 0330   K 4.3 08/11/2019 0330   CL 104 08/11/2019 0330   CO2 23 08/11/2019 0330   GLUCOSE 157 (H) 08/11/2019 0330   BUN 27 (H) 08/11/2019 0330   CREATININE 1.40 (H) 08/11/2019 0330   CALCIUM 7.9 (L) 08/11/2019 0330   GFRNONAA 52 (L) 08/11/2019 0330   GFRAA >60 08/11/2019 0330     Assessment/Plan: 1 Day Post-Op   Principal Problem:   Osteoarthritis of right knee   WBAT with walker DVT ppx: Aspirin, SCDs, TEDS PO pain control PT/OT Mild Cr elevation: encourage PO fluids, recheck BMP at 1300 Dispo: d/c home with OPPT if Cr improves    10/11/2019 Ahliya Glatt 08/11/2019, 11:17 AM   10/11/2019, MD [phone] [facility] is now [facility]  [place] [address], [address], [place], [place] Phone: [phone] [email or web address] Facebook  [phone]

## 2019-08-11 NOTE — Discharge Summary (Signed)
Physician Discharge Summary  Patient ID: Eric Reilly MRN: [id number] DOB/AGE: 66-May-1957 66 y.o.  Admit date: 08/10/2019 Discharge date: 08/11/2019  Admission Diagnoses:  Osteoarthritis of right knee  Discharge Diagnoses:  Principal Problem:   Osteoarthritis of right knee   Past Medical History:  Diagnosis Date   Arthritis    osteoarthritis, left knee   Bruises easily    Complication of anesthesia    Hypertension    PONV (postoperative nausea and vomiting)    Sleep apnea     Surgeries: Procedure(s): COMPUTER ASSISTED TOTAL KNEE ARTHROPLASTY on 08/10/2019   Consultants (if any):   Discharged Condition: Improved  Hospital Course: Kalob Bergen is an 64 y.o. male who was admitted 08/10/2019 with a diagnosis of Osteoarthritis of right knee and went to the operating room on 08/10/2019 and underwent the above named procedures.    Cr mildly elevated postop but trending down. Advised to follow up with PCP in 1 week.  He was given perioperative antibiotics:  Anti-infectives (From admission, onward)    Start     Dose/Rate Route Frequency Ordered Stop   08/10/19 1500  ceFAZolin (ANCEF) IVPB 2g/100 mL premix     2 g 200 mL/hr over 30 Minutes Intravenous Every 6 hours 08/10/19 1331 08/10/19 2037   08/10/19 0645  ceFAZolin (ANCEF) IVPB 2g/100 mL premix     2 g 200 mL/hr over 30 Minutes Intravenous On call to O.R. 08/10/19 5366 08/10/19 4403     .  He was given sequential compression devices, early ambulation, and ASA for DVT prophylaxis.  He benefited maximally from the hospital stay and there were no complications.    Recent vital signs:  Vitals:   08/11/19 0559 08/11/19 0953  BP: 103/60 138/67  Pulse: 60 71  Resp: 18 16  Temp: (!) 97.1 F (36.2 C) 97.8 F (36.6 C)  SpO2: 100% 100%    Recent laboratory studies:  Lab Results  Component Value Date   HGB 10.6 (L) 08/11/2019   HGB 13.9 08/09/2019   HGB 12.1 (L) 05/17/2013   Lab Results  Component Value Date   WBC  15.4 (H) 08/11/2019   PLT 209 08/11/2019   Lab Results  Component Value Date   INR 1.0 08/09/2019   Lab Results  Component Value Date   NA 136 08/11/2019   K 4.6 08/11/2019   CL 105 08/11/2019   CO2 24 08/11/2019   BUN 25 (H) 08/11/2019   CREATININE 1.33 (H) 08/11/2019   GLUCOSE 142 (H) 08/11/2019    Discharge Medications:   Allergies as of 08/11/2019   No Known Allergies      Medication List     TAKE these medications    aspirin 81 MG chewable tablet Chew 1 tablet (81 mg total) by mouth 2 (two) times daily.   baclofen 20 MG tablet Commonly known as: LIORESAL Take 20 mg by mouth at bedtime.   BLACK ELDERBERRY PO Take 1,000 mg by mouth daily.   Dialyvite Vitamin D 5000 125 MCG (5000 UT) capsule Generic drug: Cholecalciferol Take 5,000 Units by mouth daily. Take with 1000 unit vitamin d to equal 6000 units daily   cholecalciferol 25 MCG (1000 UNIT) tablet Commonly known as: VITAMIN D3 Take 1,000 Units by mouth daily. Take with 5000 unit vitamin d to equal 6000 units daily   docusate sodium 100 MG capsule Commonly known as: COLACE Take 1 capsule (100 mg total) by mouth 2 (two) times daily.   Echinacea 500 MG Caps Take 500  mg by mouth daily.   Flax Seed Oil 1000 MG Caps Take 1,000 mg by mouth daily.   HYDROcodone-acetaminophen 5-325 MG tablet Commonly known as: NORCO/VICODIN Take 1 tablet by mouth every 4 (four) hours as needed for moderate pain (pain score 4-6).   L-ARGININE PO Take 1,000 mg by mouth daily.   lisinopril-hydrochlorothiazide 20-12.5 MG tablet Commonly known as: ZESTORETIC Take 2 tablets by mouth daily.   meloxicam 7.5 MG tablet Commonly known as: MOBIC Take 7.5 mg by mouth daily as needed for pain.   ondansetron 4 MG tablet Commonly known as: ZOFRAN Take 1 tablet (4 mg total) by mouth every 6 (six) hours as needed for nausea.   senna 8.6 MG Tabs tablet Commonly known as: SENOKOT Take 2 tablets (17.2 mg total) by mouth at  bedtime.   vitamin C 1000 MG tablet Take 2,000 mg by mouth daily.   Zinc 50 MG Caps Take 50 mg by mouth daily.        Diagnostic Studies: DG Knee Right Port  Result Date: 08/10/2019 CLINICAL DATA:  Right knee arthroplasty EXAM: PORTABLE RIGHT KNEE - 1-2 VIEW COMPARISON:  08/24/2018 FINDINGS: Interval postsurgical changes from right total knee arthroplasty. Arthroplasty components are in their expected alignment without evidence of complication. Expected postoperative changes within the anterior and intra-articular soft tissues. IMPRESSION: Interval postsurgical changes from right total knee arthroplasty. Electronically Signed   By: Duanne Guess D.O.   On: 08/10/2019 12:21     Disposition: Discharge disposition: 01-Home or Self Care      Discharge Instructions     Call MD / Call 911   Complete by: As directed    If you experience chest pain or shortness of breath, CALL 911 and be transported to the hospital emergency room.  If you develope a fever above 101 F, pus (white drainage) or increased drainage or redness at the wound, or calf pain, call your surgeon's office.   Constipation Prevention   Complete by: As directed    Drink plenty of fluids.  Prune juice may be helpful.  You may use a stool softener, such as Colace (over the counter) 100 mg twice a day.  Use MiraLax (over the counter) for constipation as needed.   Diet - low sodium heart healthy   Complete by: As directed    Do not put a pillow under the knee. Place it under the heel.   Complete by: As directed    Driving restrictions   Complete by: As directed    No driving for 6 weeks   Increase activity slowly as tolerated   Complete by: As directed    Lifting restrictions   Complete by: As directed    No lifting for 6 weeks   TED hose   Complete by: As directed    Use stockings (TED hose) for 2 weeks on both leg(s).  You may remove them at night for sleeping.       Follow-up Information     Kasumi Ditullio,  Arlys John, MD. Schedule an appointment as soon as possible for a visit in 2 weeks.   Specialty: Orthopedic Surgery Why: For wound re-check Contact information: [address] [place] [address] [phone]             Signed: Iline Oven Mallissa Lorenzen 08/14/2019, 10:58 PM

## 2019-08-11 NOTE — Progress Notes (Signed)
Swinteck, Arlys John, MD gave verbal orders for a BMP at 1300.

## 2020-07-28 IMAGING — DX DG KNEE 1-2V PORT*R*
2 series · 2 of 2 positions shown · non-contrast
Comparison: 08/24/2018

CLINICAL DATA: Right knee arthroplasty

EXAM:
PORTABLE RIGHT KNEE - 1-2 VIEW

[knee ap]
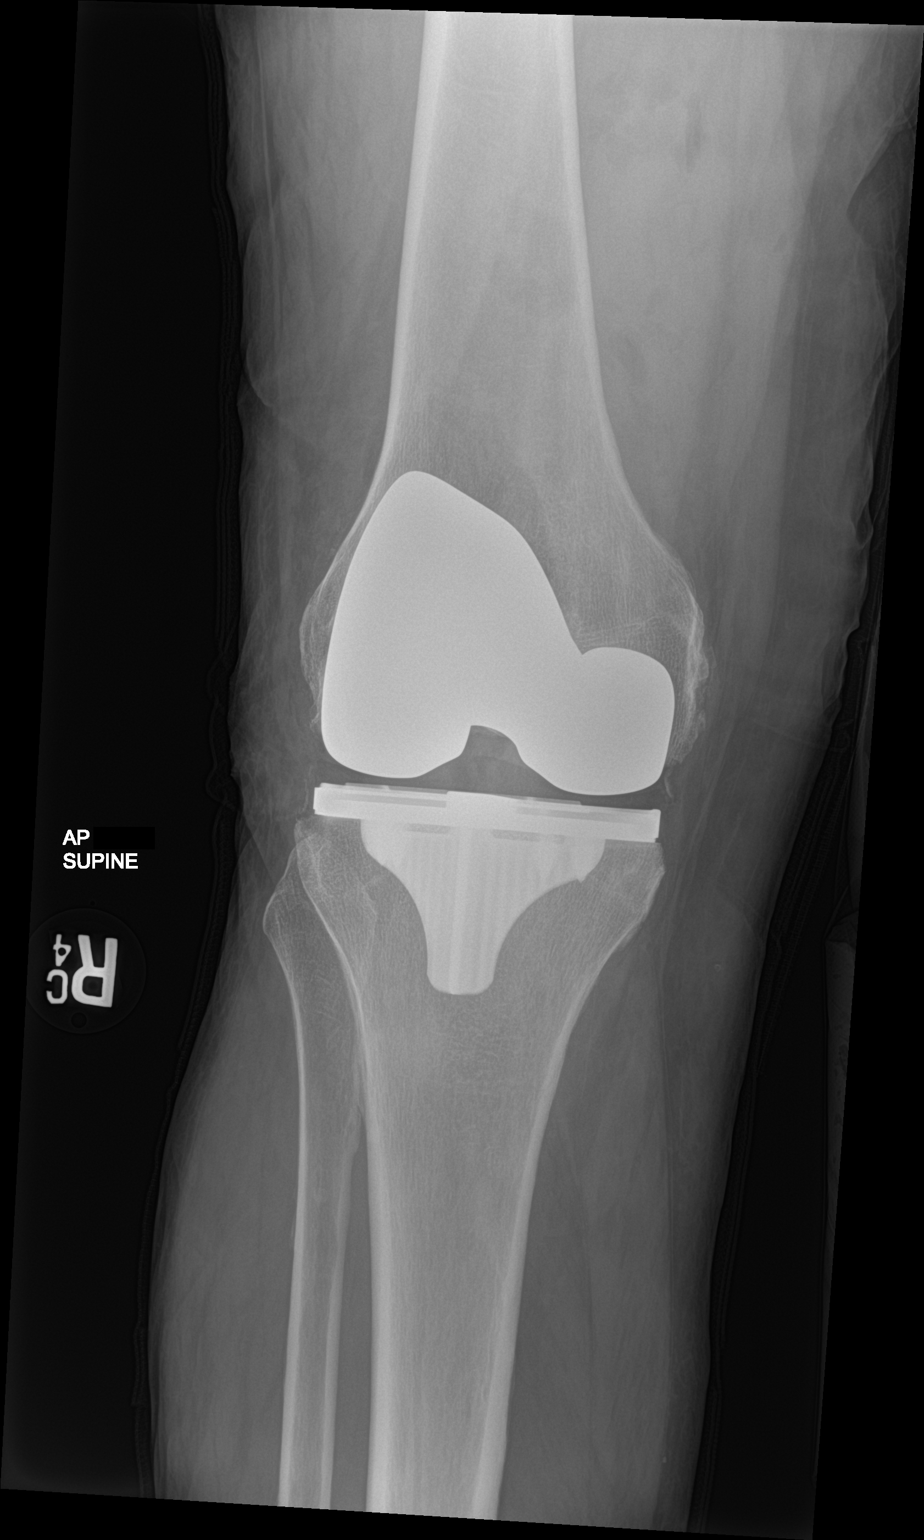

[knee lat]
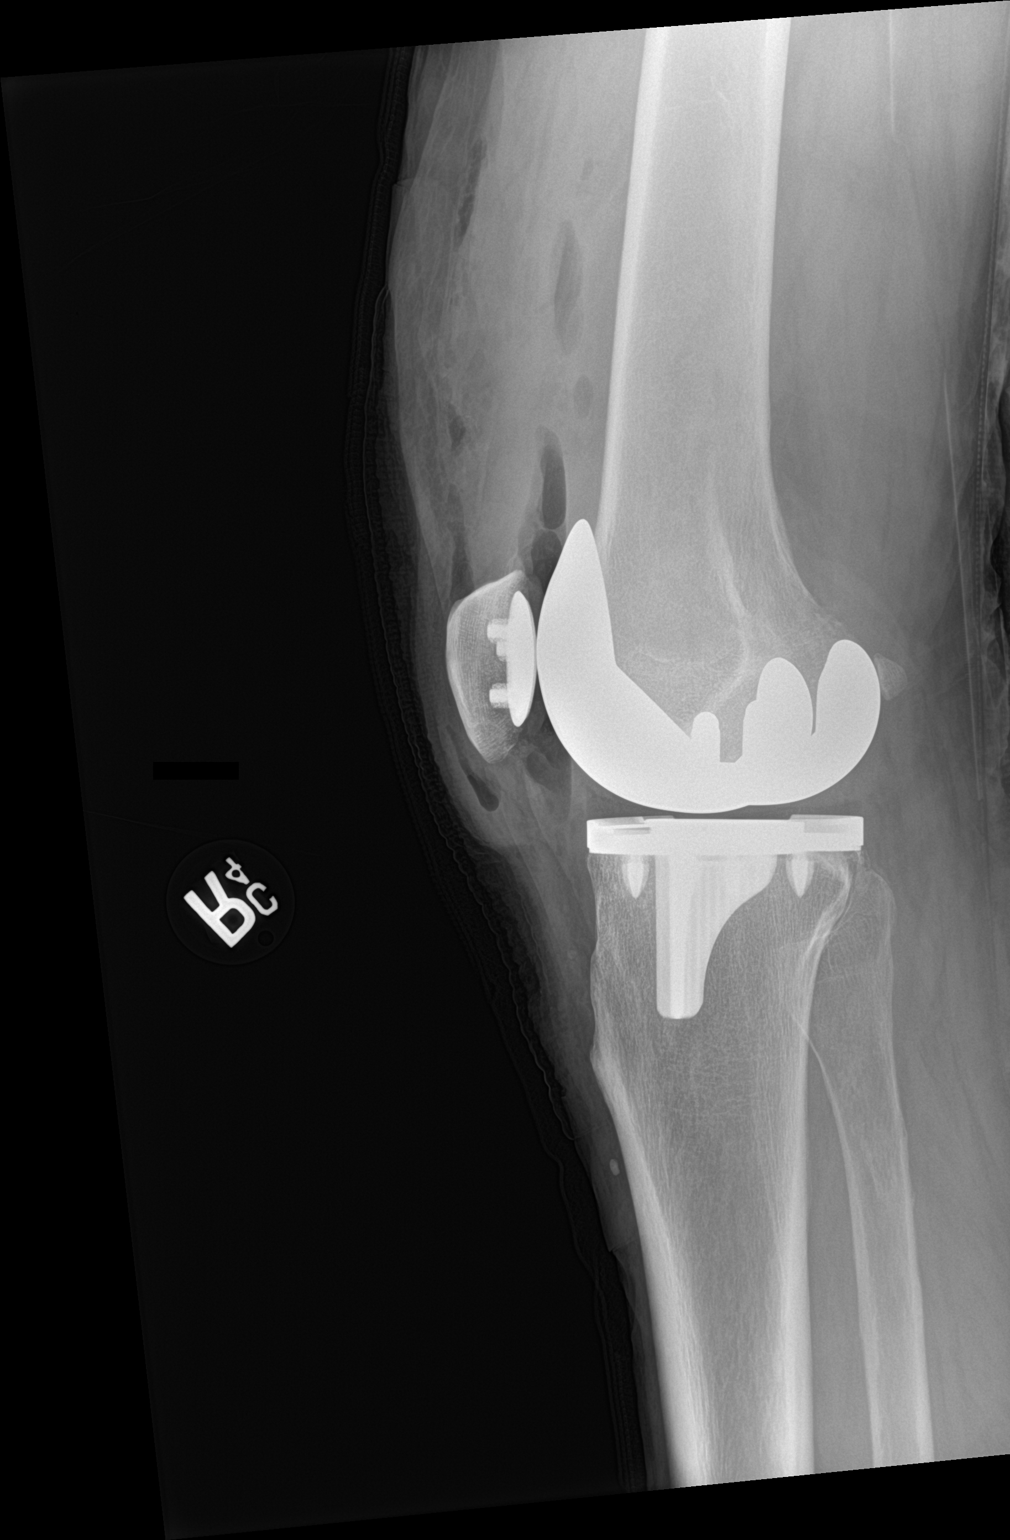

[2 of 2 positions shown; findings below may reference images not displayed]

FINDINGS: Interval postsurgical changes from right total knee arthroplasty.
Arthroplasty components are in their expected alignment without
evidence of complication. Expected postoperative changes within the
anterior and intra-articular soft tissues.
IMPRESSION: Interval postsurgical changes from right total knee arthroplasty.
